# Patient Record
Sex: Female | Born: 1950 | Race: White | Hispanic: No | Marital: Married | Smoking: Never smoker
Health system: Southern US, Community
[De-identification: ages and names within clinical notes are randomized; demographics above are authoritative.]

## PROBLEM LIST (undated history)

## (undated) DIAGNOSIS — C4491 Basal cell carcinoma of skin, unspecified: Secondary | ICD-10-CM

## (undated) DIAGNOSIS — N393 Stress incontinence (female) (male): Secondary | ICD-10-CM

## (undated) DIAGNOSIS — I493 Ventricular premature depolarization: Secondary | ICD-10-CM

## (undated) DIAGNOSIS — E785 Hyperlipidemia, unspecified: Secondary | ICD-10-CM

## (undated) DIAGNOSIS — G4700 Insomnia, unspecified: Secondary | ICD-10-CM

## (undated) DIAGNOSIS — S0300XA Dislocation of jaw, unspecified side, initial encounter: Secondary | ICD-10-CM

## (undated) DIAGNOSIS — K120 Recurrent oral aphthae: Secondary | ICD-10-CM

## (undated) DIAGNOSIS — F419 Anxiety disorder, unspecified: Secondary | ICD-10-CM

## (undated) DIAGNOSIS — G43909 Migraine, unspecified, not intractable, without status migrainosus: Secondary | ICD-10-CM

## (undated) DIAGNOSIS — K529 Noninfective gastroenteritis and colitis, unspecified: Secondary | ICD-10-CM

## (undated) DIAGNOSIS — I491 Atrial premature depolarization: Secondary | ICD-10-CM

## (undated) DIAGNOSIS — H9319 Tinnitus, unspecified ear: Secondary | ICD-10-CM

## (undated) DIAGNOSIS — M1990 Unspecified osteoarthritis, unspecified site: Secondary | ICD-10-CM

## (undated) DIAGNOSIS — J309 Allergic rhinitis, unspecified: Secondary | ICD-10-CM

## (undated) DIAGNOSIS — R519 Headache, unspecified: Secondary | ICD-10-CM

## (undated) DIAGNOSIS — R002 Palpitations: Secondary | ICD-10-CM

## (undated) DIAGNOSIS — G629 Polyneuropathy, unspecified: Secondary | ICD-10-CM

## (undated) DIAGNOSIS — R51 Headache: Secondary | ICD-10-CM

## (undated) DIAGNOSIS — G8929 Other chronic pain: Secondary | ICD-10-CM

## (undated) DIAGNOSIS — J45909 Unspecified asthma, uncomplicated: Secondary | ICD-10-CM

## (undated) HISTORY — DX: Headache: R51

## (undated) HISTORY — PX: HAND SURGERY: SHX662

## (undated) HISTORY — DX: Other chronic pain: G89.29

## (undated) HISTORY — DX: Palpitations: R00.2

## (undated) HISTORY — DX: Noninfective gastroenteritis and colitis, unspecified: K52.9

## (undated) HISTORY — DX: Atrial premature depolarization: I49.1

## (undated) HISTORY — DX: Insomnia, unspecified: G47.00

## (undated) HISTORY — DX: Dislocation of jaw, unspecified side, initial encounter: S03.00XA

## (undated) HISTORY — DX: Stress incontinence (female) (male): N39.3

## (undated) HISTORY — DX: Ventricular premature depolarization: I49.3

## (undated) HISTORY — DX: Recurrent oral aphthae: K12.0

## (undated) HISTORY — DX: Polyneuropathy, unspecified: G62.9

## (undated) HISTORY — PX: MOLE REMOVAL: SHX2046

## (undated) HISTORY — DX: Migraine, unspecified, not intractable, without status migrainosus: G43.909

## (undated) HISTORY — DX: Unspecified osteoarthritis, unspecified site: M19.90

## (undated) HISTORY — DX: Headache, unspecified: R51.9

## (undated) HISTORY — DX: Basal cell carcinoma of skin, unspecified: C44.91

## (undated) HISTORY — DX: Allergic rhinitis, unspecified: J30.9

## (undated) HISTORY — DX: Unspecified asthma, uncomplicated: J45.909

## (undated) HISTORY — DX: Hyperlipidemia, unspecified: E78.5

## (undated) HISTORY — DX: Tinnitus, unspecified ear: H93.19

## (undated) HISTORY — DX: Anxiety disorder, unspecified: F41.9

## (undated) HISTORY — PX: OTHER SURGICAL HISTORY: SHX169

---

## 1975-10-25 HISTORY — PX: RHINOPLASTY: SUR1284

## 1994-09-23 HISTORY — PX: BREAST FIBROADENOMA SURGERY: SHX580

## 1994-10-24 HISTORY — PX: TENDON RELEASE: SHX230

## 2003-07-25 ENCOUNTER — Ambulatory Visit (HOSPITAL_BASED_OUTPATIENT_CLINIC_OR_DEPARTMENT_OTHER): Admission: RE | Admit: 2003-07-25 | Discharge: 2003-07-25 | Payer: Self-pay | Admitting: Orthopedic Surgery

## 2003-07-25 ENCOUNTER — Encounter (INDEPENDENT_AMBULATORY_CARE_PROVIDER_SITE_OTHER): Payer: Self-pay | Admitting: Specialist

## 2003-07-25 ENCOUNTER — Ambulatory Visit (HOSPITAL_COMMUNITY): Admission: RE | Admit: 2003-07-25 | Discharge: 2003-07-25 | Payer: Self-pay | Admitting: Orthopedic Surgery

## 2004-01-05 ENCOUNTER — Ambulatory Visit (HOSPITAL_COMMUNITY): Admission: RE | Admit: 2004-01-05 | Discharge: 2004-01-05 | Payer: Self-pay | Admitting: Gastroenterology

## 2004-04-21 ENCOUNTER — Other Ambulatory Visit: Admission: RE | Admit: 2004-04-21 | Discharge: 2004-04-21 | Payer: Self-pay | Admitting: *Deleted

## 2005-05-03 ENCOUNTER — Other Ambulatory Visit: Admission: RE | Admit: 2005-05-03 | Discharge: 2005-05-03 | Payer: Self-pay | Admitting: *Deleted

## 2006-06-12 ENCOUNTER — Other Ambulatory Visit: Admission: RE | Admit: 2006-06-12 | Discharge: 2006-06-12 | Payer: Self-pay | Admitting: Obstetrics & Gynecology

## 2006-10-25 ENCOUNTER — Encounter: Admission: RE | Admit: 2006-10-25 | Discharge: 2006-10-25 | Payer: Self-pay | Admitting: Family Medicine

## 2006-11-02 ENCOUNTER — Encounter: Admission: RE | Admit: 2006-11-02 | Discharge: 2006-11-02 | Payer: Self-pay | Admitting: Family Medicine

## 2006-12-08 ENCOUNTER — Encounter: Admission: RE | Admit: 2006-12-08 | Discharge: 2006-12-08 | Payer: Self-pay | Admitting: Family Medicine

## 2007-01-23 HISTORY — PX: BILATERAL SALPINGOOPHORECTOMY: SHX1223

## 2007-02-20 ENCOUNTER — Encounter (INDEPENDENT_AMBULATORY_CARE_PROVIDER_SITE_OTHER): Payer: Self-pay | Admitting: Specialist

## 2007-02-20 ENCOUNTER — Ambulatory Visit (HOSPITAL_COMMUNITY): Admission: RE | Admit: 2007-02-20 | Discharge: 2007-02-20 | Payer: Self-pay | Admitting: Obstetrics & Gynecology

## 2007-06-05 ENCOUNTER — Ambulatory Visit (HOSPITAL_BASED_OUTPATIENT_CLINIC_OR_DEPARTMENT_OTHER): Admission: RE | Admit: 2007-06-05 | Discharge: 2007-06-05 | Payer: Self-pay | Admitting: Orthopedic Surgery

## 2007-06-05 ENCOUNTER — Encounter (INDEPENDENT_AMBULATORY_CARE_PROVIDER_SITE_OTHER): Payer: Self-pay | Admitting: Orthopedic Surgery

## 2007-06-21 ENCOUNTER — Other Ambulatory Visit: Admission: RE | Admit: 2007-06-21 | Discharge: 2007-06-21 | Payer: Self-pay | Admitting: Obstetrics & Gynecology

## 2008-04-10 IMAGING — US US TRANSVAGINAL NON-OB
1 series · 14 of 25 positions shown · non-contrast
Comparison: None.

TRANSABDOMINAL AND TRANSVAGINAL PELVIC ULTRASOUND:

CLINICAL DATA: Lower abdominal pain
TECHNIQUE: Both transabdominal and transvaginal ultrasound examinations of the
pelvis were performed including evaluation of the uterus, ovaries, adnexal
regions, and pelvic cul-de-sac.

[Series 1: us transvaginal non-ob · 0.24mm/px · 14 of 52 slices shown]
[im 1/52]
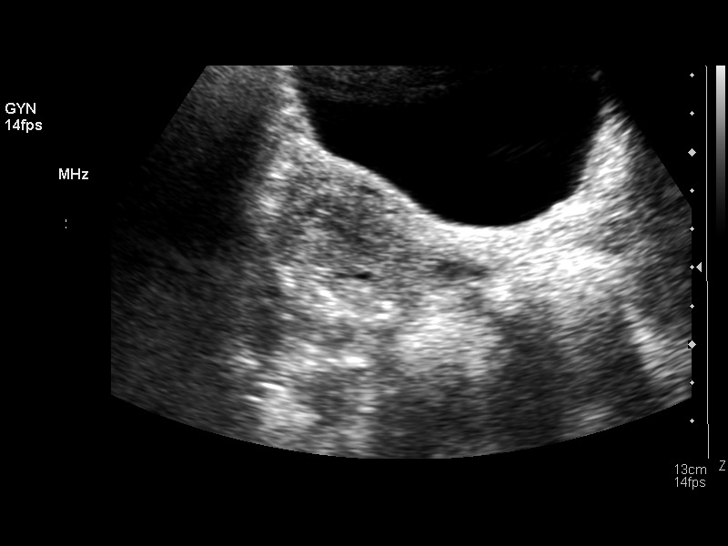
[im 5/52]
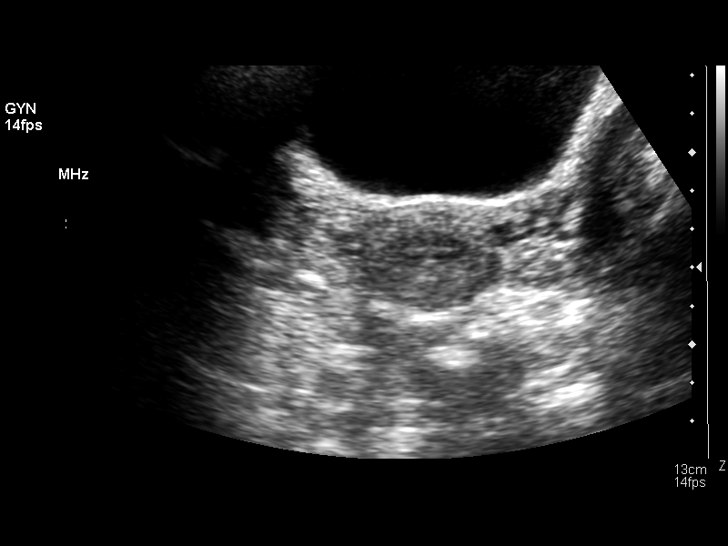
[im 9/52]
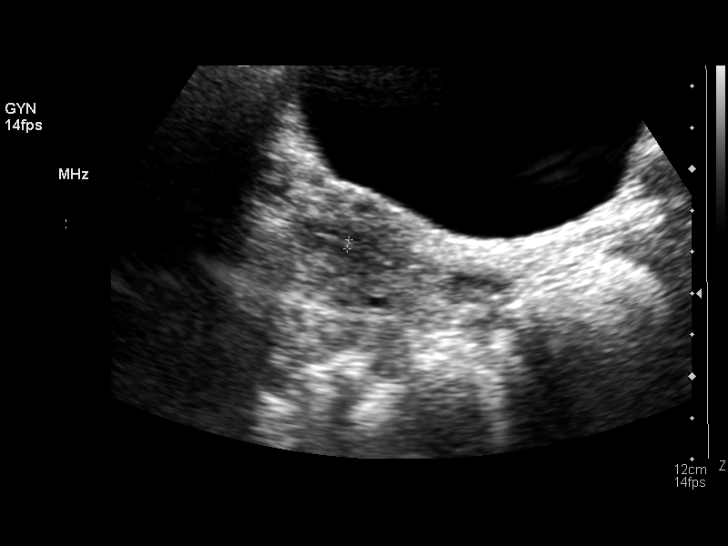
[im 13/52]
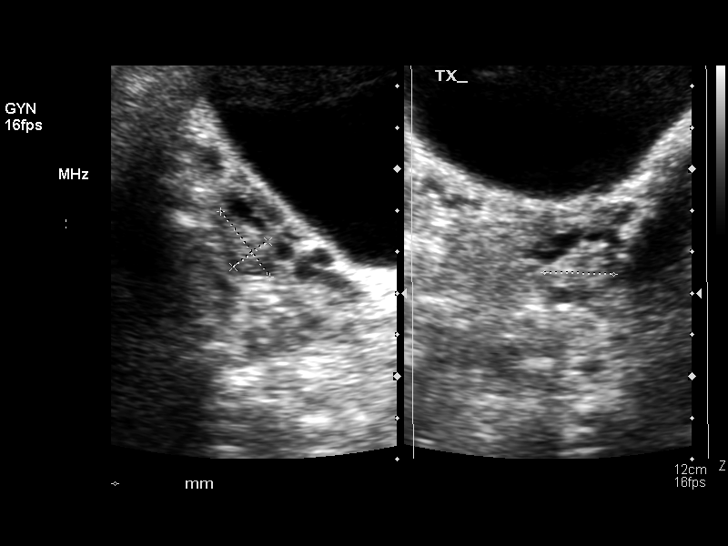
[im 18/52]
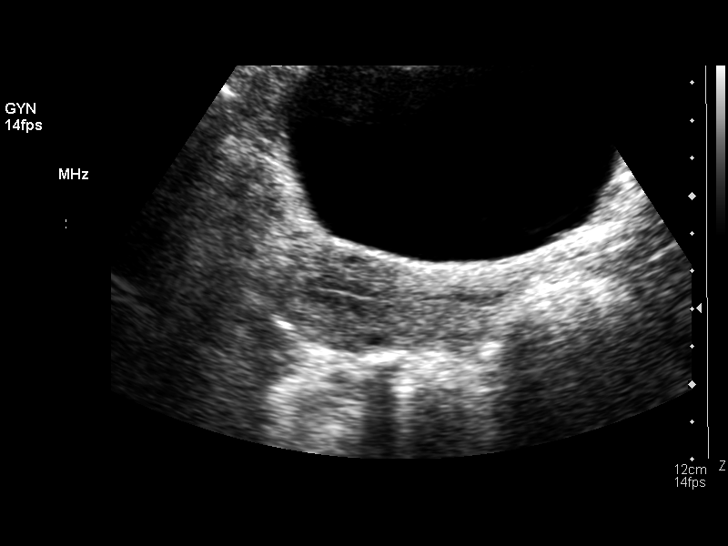
[im 20/52]
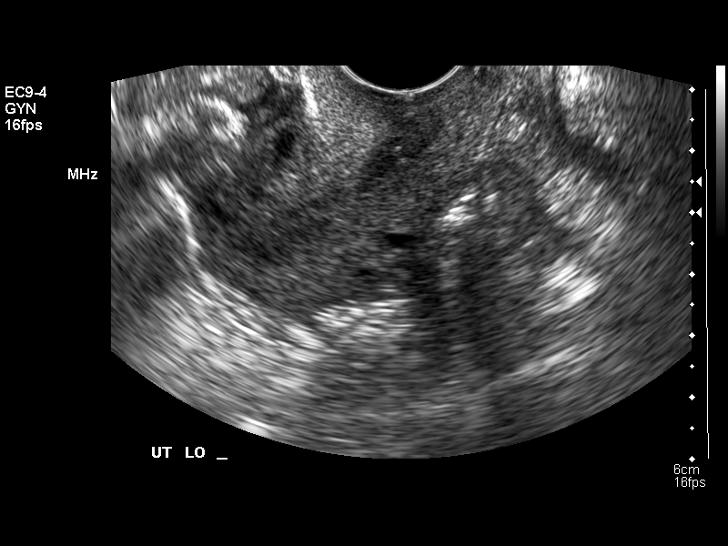
[im 24/52]
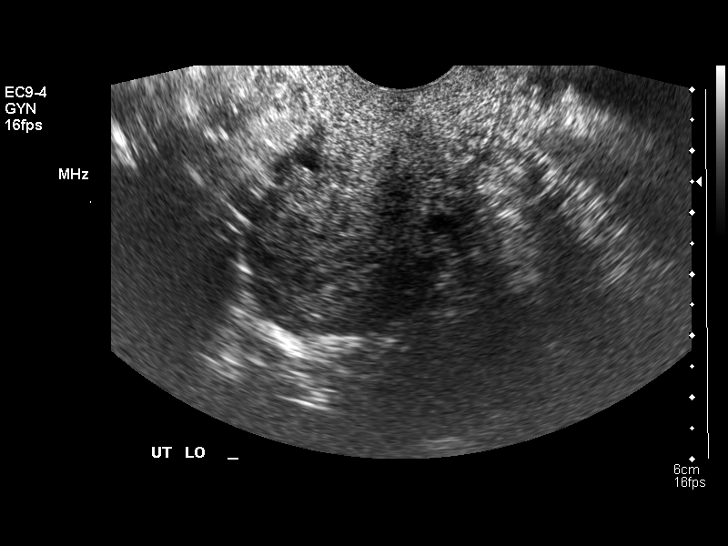
[im 28/52]
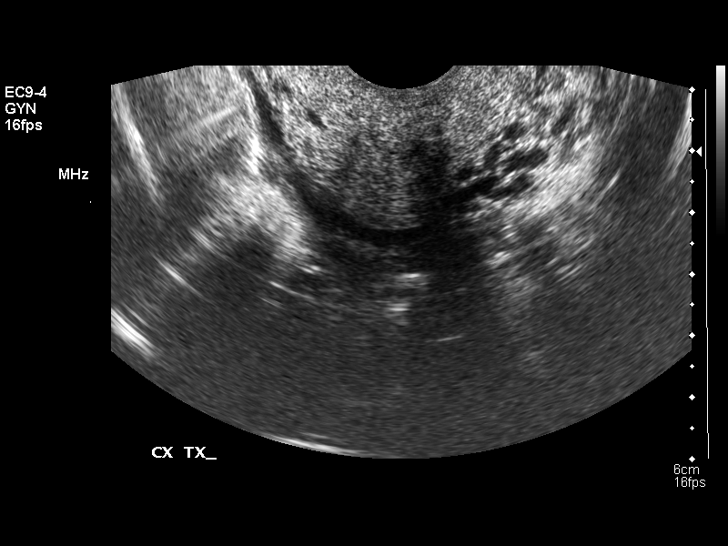
[im 32/52]
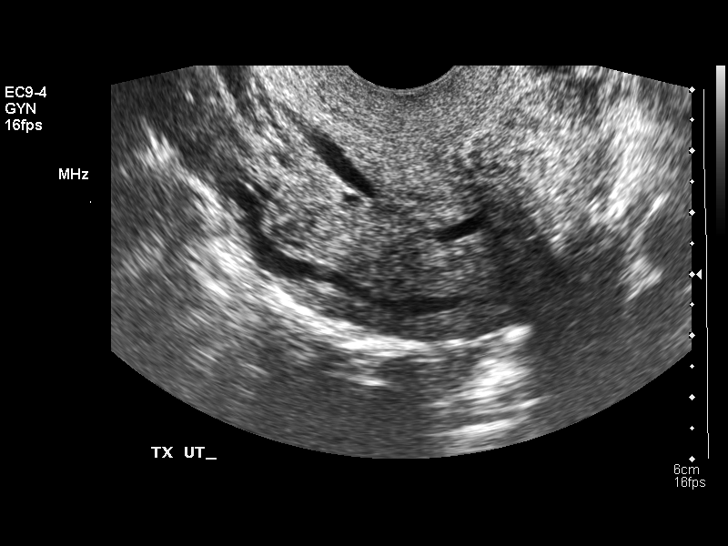
[im 35/52]
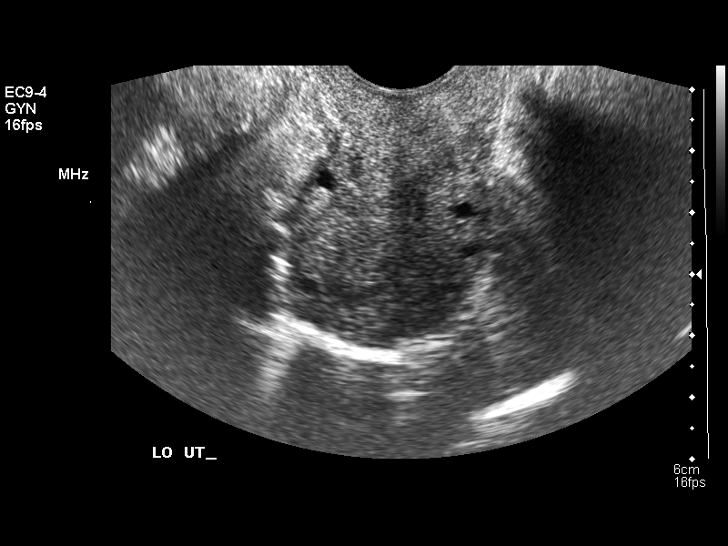
[im 39/52]
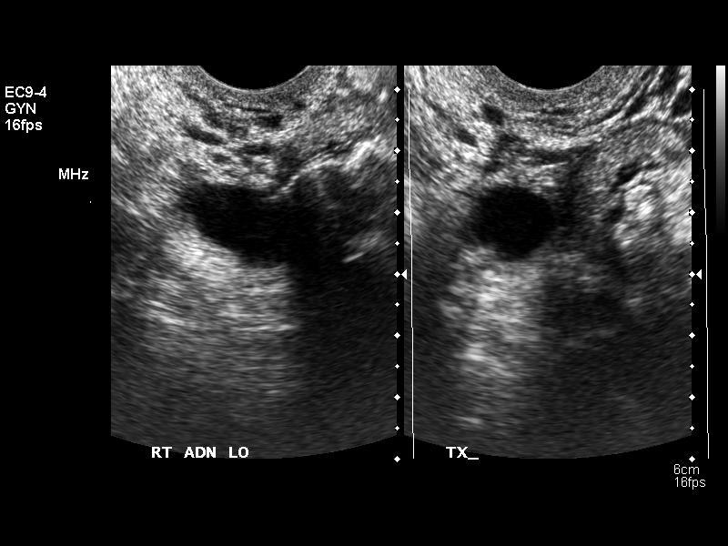
[im 43/52]
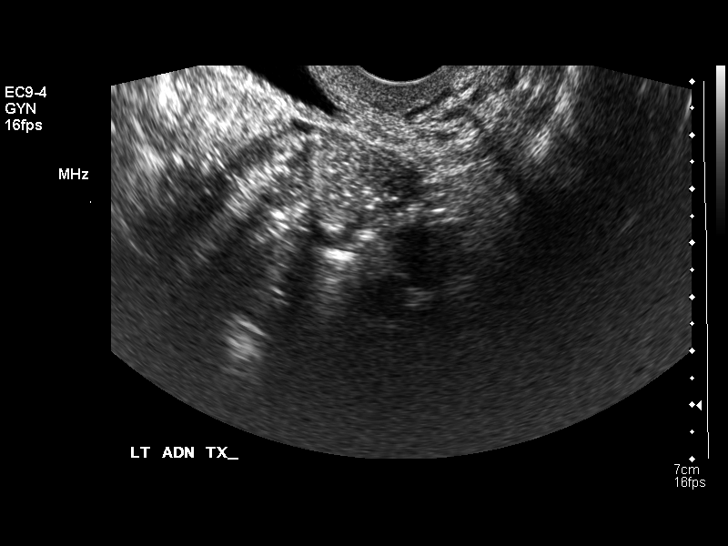
[im 47/52]
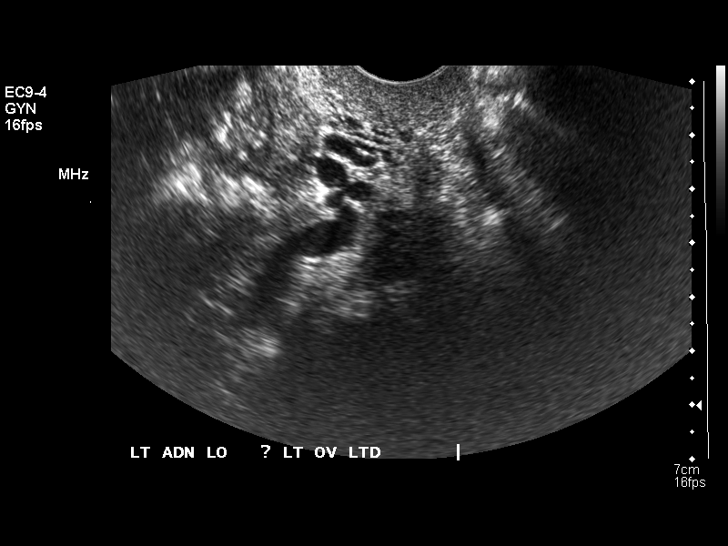
[im 52/52]
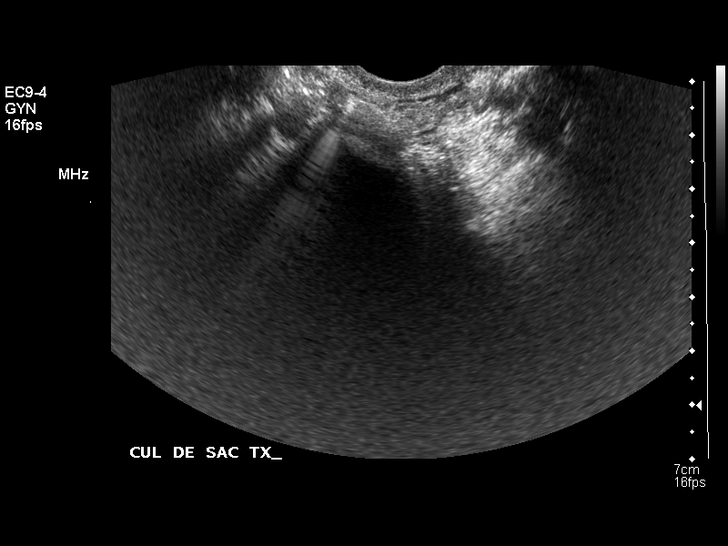

[14 of 25 positions shown; findings below may reference images not displayed]

FINDINGS: The uterus measures 6.1 x 2.9 x 3.8 cm.  Myometrial echotexture is
homogeneous. No evidence for fibroids.  Endometrial stripe thickness is 4.4 mm.

The ovaries are difficult to visualize. A 1.9 x 1.4 x 1.5 cm cystic focus in the
right adnexal space may be the right ovary. The left ovary measures 1.1 x 1.6 x
1.0 cm.

No evidence for free fluid in the cul-de-sac.
IMPRESSION: No sonographic evidence to explain this patient's history of pain.

Cystic area in the right adnexal space may be related to the right ovary. This
measures 1.9 cm in maximal dimension. In a postmenopausal female, persistent
ovarian cystic change would be considered abnormal. Followup ultrasound in 6
weeks is recommended to ensure resolution.

## 2008-06-23 ENCOUNTER — Other Ambulatory Visit: Admission: RE | Admit: 2008-06-23 | Discharge: 2008-06-23 | Payer: Self-pay | Admitting: Obstetrics and Gynecology

## 2009-06-24 DIAGNOSIS — G629 Polyneuropathy, unspecified: Secondary | ICD-10-CM

## 2009-06-24 HISTORY — DX: Polyneuropathy, unspecified: G62.9

## 2009-09-03 ENCOUNTER — Encounter: Admission: RE | Admit: 2009-09-03 | Discharge: 2009-09-03 | Payer: Self-pay | Admitting: Emergency Medicine

## 2011-02-18 IMAGING — CR DG LUMBAR SPINE COMPLETE 4+V
5 series · 5 of 5 positions shown · non-contrast
Comparison: None available

CLINICAL DATA: Low back pain

LUMBAR SPINE - COMPLETE 4+ VIEW

[view not recorded (1 of 5)]
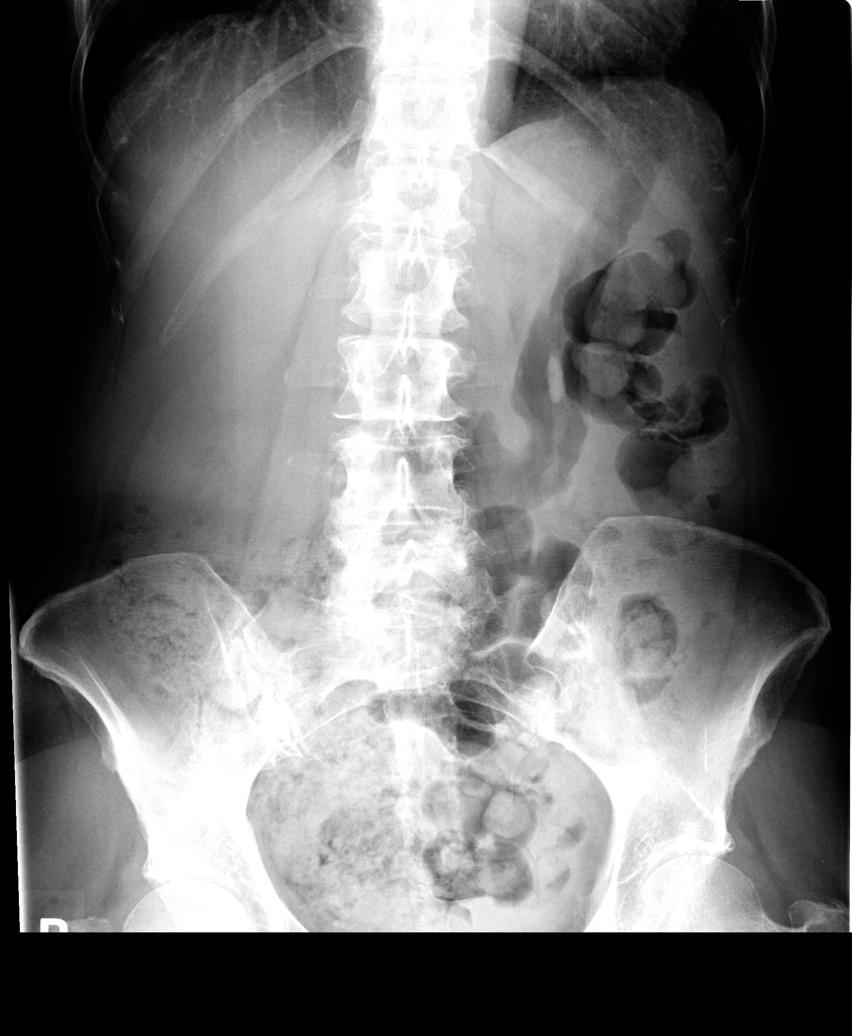

[view not recorded (2 of 5)]
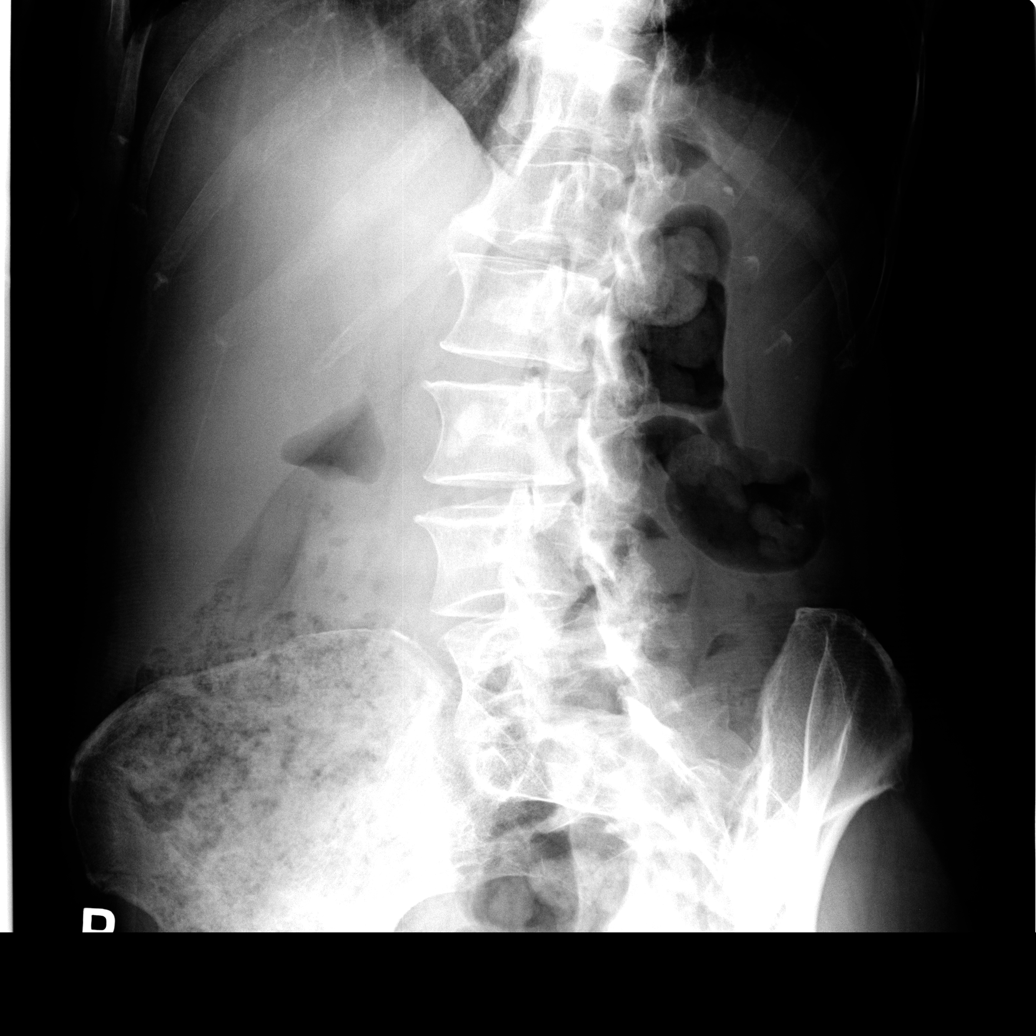

[view not recorded (3 of 5)]
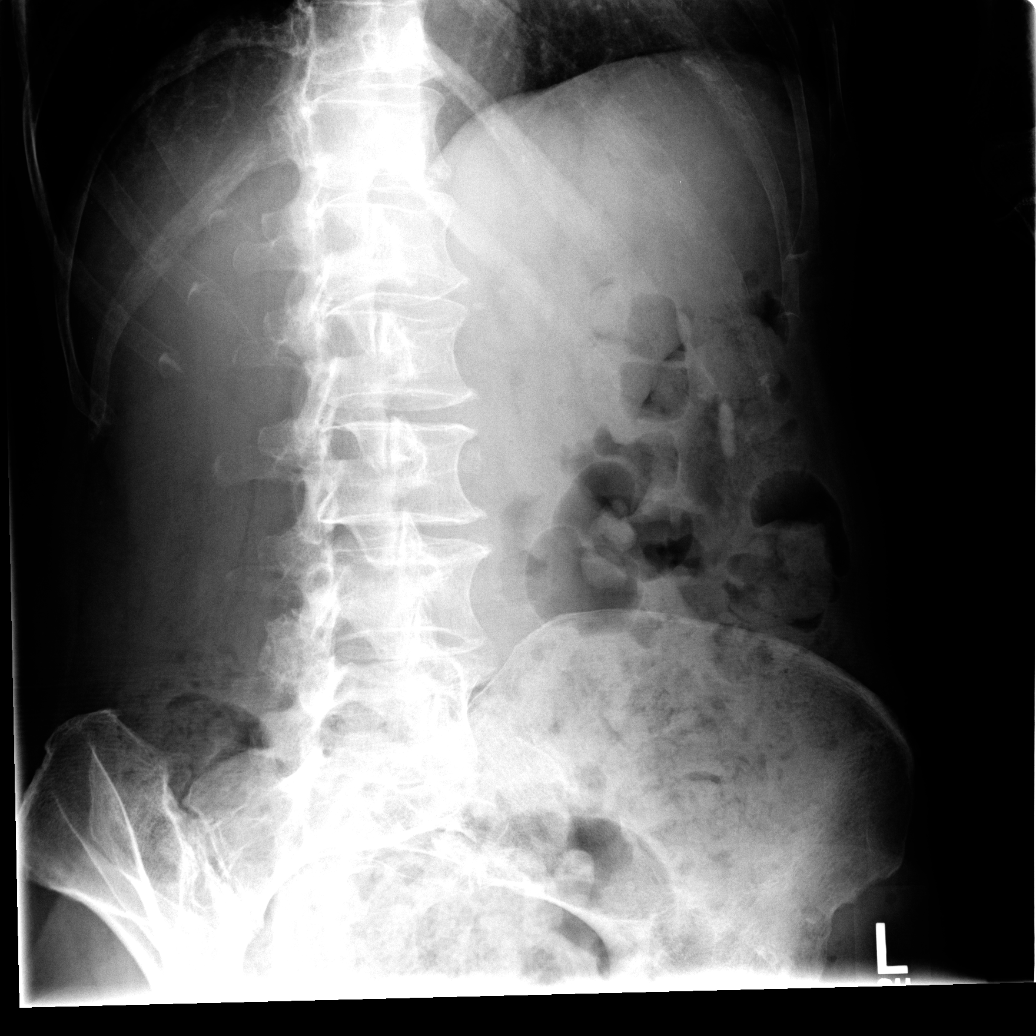

[view not recorded (4 of 5)]
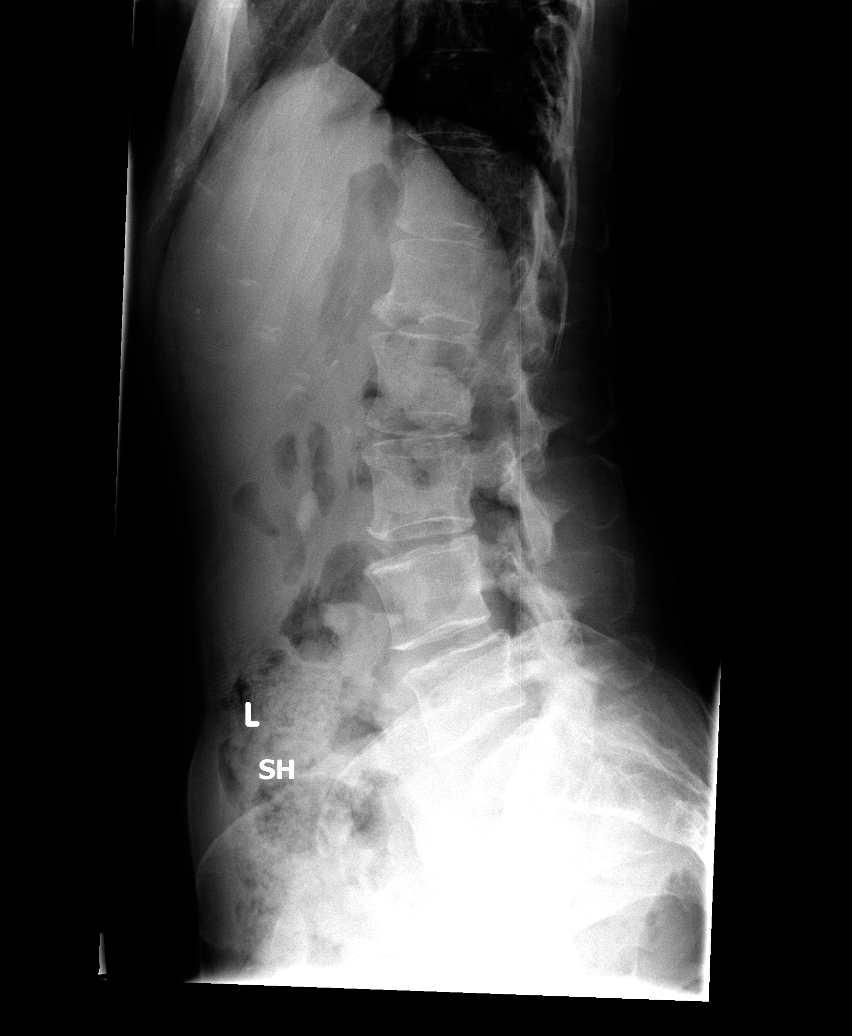

[view not recorded (5 of 5)]
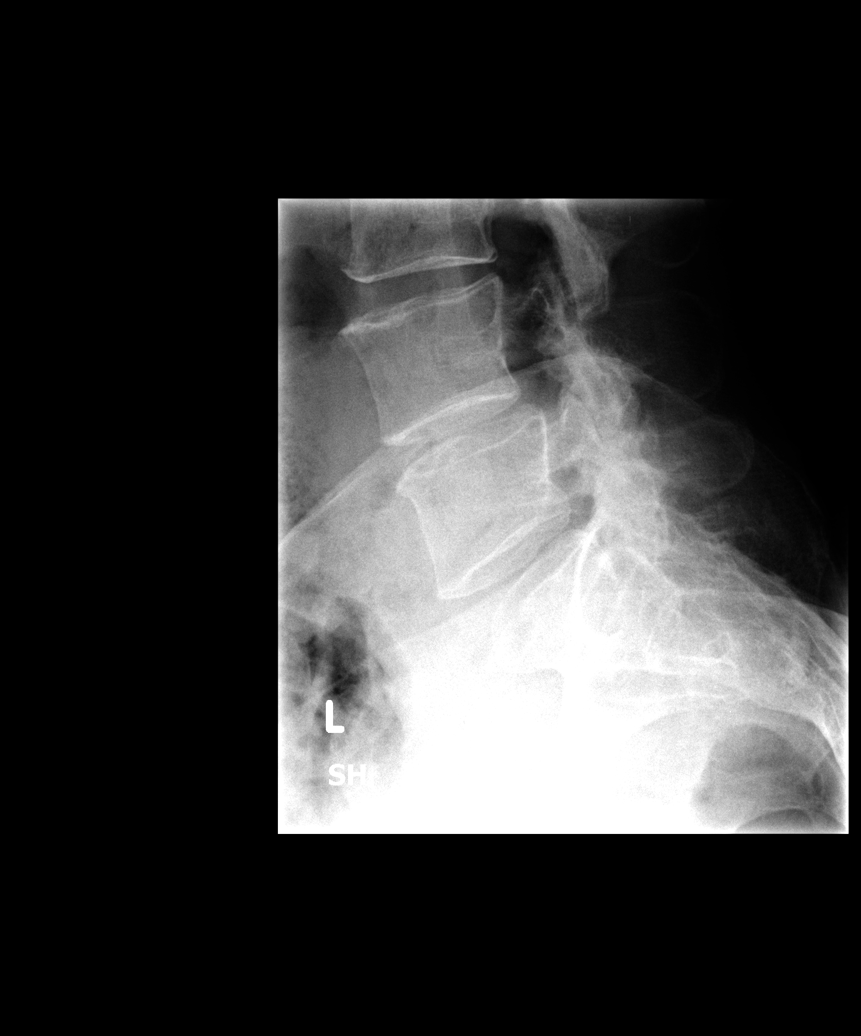

[5 of 5 positions shown; findings below may reference images not displayed]

FINDINGS: There are five lumbar-type vertebral bodies.  Lateral
projection demonstrates no loss of vertebral body height or disc
height.  There is endplate osteophytosis at essentially all levels.
This is mild in severity.  No evidence subluxation.  No evidence of
pars fracture on the oblique projections.

There is a moderate volume of stool within the colon rectosigmoid
colon.
IMPRESSION: 1.  No acute findings lumbar spine.
2.  Mild disc osteophytic disease.
3.  Moderate volume of stool.

## 2011-03-08 NOTE — Op Note (Signed)
NAMELAURIEANN, [name]                 ACCOUNT NO.:  [id number]   MEDICAL RECORD NO.:  [id number]          PATIENT TYPE:  AMB   LOCATION:  DSC                          FACILITY:  [facility]   PHYSICIAN:  Cindee Salt, M.D.       DATE OF BIRTH:  1951-03-23   DATE OF PROCEDURE:  06/05/2007  DATE OF DISCHARGE:                               OPERATIVE REPORT   PREOPERATIVE DIAGNOSIS:  Mucoid cyst right middle finger.   POSTOPERATIVE DIAGNOSIS:  Mucoid cyst right middle finger.   OPERATION:  Excision of mucoid cyst, debridement if distal  interphalangeal joint -- right middle finger.   SURGEON:  Cindee Salt, M.D.   ASSISTANT:  Carolyne Fiscal, RN   ANESTHESIA:  Forearm-based IV regional.   HISTORY:  The patient is a 60 year old female with a history of a large  mucoid cyst of the right middle finger.  She is desirous of having this  removed.  She is aware of the risks and complications, including  infection, recurrence of injury to arteries, nerves, tendons, not  complete relief of symptoms, dystrophy, loss of skin.  She is desirous  of proceeding.  The preoperative area of the extremity was marked by  both the patient and surgeon; questions were encouraged and answered.  Antibiotic given.   PROCEDURE:  The patient was brought to the operating room.  A forearm-  based IV regional anesthetic carried out without difficulty.  She was  prepped using DuraPrep in the supine position, with the right arm free.  A curvilinear incision was made over the GIP joint of her right middle  finger, carried down through subcutaneous tissue.  Bleeders were  electrocauterized.  The cyst was immediately encountered; with blunt and  sharp dissection this was dissected free.  The joint was then opened  radially and ulnarly.  A small House curet and small rongeur was then  used to remove osteophytes and do a partial synovectomy.  The joint was  then irrigated.  Skin was closed with interrupted 5-0 Vicryl Rapide  sutures.   Sterile compressive dressing and splint to the finger, holding  in full extension, was applied.  The patient tolerated the procedure  well and was taken to the recovery room for observation in satisfactory  condition.  She is discharged to home, to return the [facility] in 1 week on [place] NF.           ______________________________  Cindee Salt, M.D.     GK/MEDQ  D:  06/05/2007  T:  06/06/2007  Job:  [id number]   cc:   Oley Balm. Georgina Pillion, M.D.

## 2011-03-11 NOTE — Op Note (Signed)
NAME:  Margaret [name], Margaret [name]                           ACCOUNT NO.:  [id number]   MEDICAL RECORD NO.:  [id number]                   PATIENT TYPE:  AMB   LOCATION:  ENDO                                 [facility]ACILITY[facility]  [facility]   PHYSICIAN:  Graylin Shiver, M.D.                DATE OF BIRTH:  Aug 17, 1951   DATE OF PROCEDURE:  01/05/2004  DATE OF DISCHARGE:                                 OPERATIVE REPORT   PROCEDURE:  Colonoscopy.   INDICATION FOR PROCEDURE:  Screening.   Informed consent was obtained after explanation of the risks of bleeding,  infection, and perforation.   PREMEDICATIONS:  1. Fentanyl 62.5 mcg IV.  2. Versed 6 mg IV.   DESCRIPTION OF PROCEDURE:  With the patient in the left lateral decubitus  position, a rectal exam was performed.  No masses were felt.  The Olympus  colonoscope was inserted into the rectum and advanced around the colon to  the cecum.  Cecal landmarks were identified.  The cecum and ascending colon  were normal, the transverse colon normal.  The descending colon, sigmoid,  and rectum were normal.  She tolerated the procedure well without  complications.   IMPRESSION:  Normal colonoscopy to the cecum.                                               Graylin Shiver, M.D.    SFG/MEDQ  D:  01/05/2004  T:  01/05/2004  Job:  [id number]   cc:   Dr. Earlene Plater

## 2011-03-11 NOTE — Op Note (Signed)
Margaret [name], Margaret [name]                 ACCOUNT NO.:  [id number]   MEDICAL RECORD NO.:  [id number]          PATIENT TYPE:  AMB   LOCATION:  SDC                           FACILITY:  [facility]   PHYSICIAN:  M. Leda Quail, MD  DATE OF BIRTH:  1951/03/23   DATE OF PROCEDURE:  02/20/2007  DATE OF DISCHARGE:                               OPERATIVE REPORT   PREOPERATIVE DIAGNOSES:  68. A 60 year old G3, P0 married white female with right hydrosalpinx.  2. Atypical migraines.  3. History of breast fibroadenoma.   POSTOPERATIVE DIAGNOSIS:  A right paratubal cyst.  She had some  adhesions of the right ascending colon to the abdominal sidewall.   PROCEDURE:  Bilateral salpingo-oophorectomy done laparoscopically with  lysis of adhesions.   SURGEON:  Dr. Hyacinth Meeker   ASSISTANT:  Dr. Tresa Res   ANESTHESIA:  General endotracheal.   SPECIMENS:  Bilateral tubes and ovaries sent to pathology.   ESTIMATED BLOOD LOSS:  Minimal.   FLUIDS:  1400 mL of LR.   URINE OUTPUT:  200 mL.   COMPLICATIONS:  None.   INDICATIONS:  Ms. Margaret [name] is a 60 year old married white female who  does have a history of long-term chronic abdominal and pelvic  discomfort.  She has undergone a significant workup that did include a  pelvic ultrasound.  On the ultrasound, there was noted to be a right  hydrosalpinx that measured about 3 cm.  This has been followed  conservatively.  However, the patient is very anxious about this  finding.  She is an Astronomer. and quite knowledgeable.  This is causing some  mental distress, and she and I have discussed the most likely benign  nature of this finding.  She, however, has continued to desire surgical  excision.  We have discussed the risks and benefits of this, and she has  decided to go ahead and proceed with this.   PROCEDURE:  The patient is taken to the operating room.  She is placed  in supine position.  Anesthesia is administered by the anesthesia staff  without difficulty.  The  legs are positioned in the [place] stirrups in  the low lithotomy position.  Care is taken to ensure that the legs are  sitting loose in the [place] stirrups with no pressure points.  Sequential  compression devices are on her calves bilaterally.  The abdomen,  perineum, inner thighs, and vagina are prepped in a normal sterile  fashion.  Bivalve speculum is placed in the vagina.  The anterior lip of  the cervix is grasped with single-tooth tenaculum.  An acorn uterine  manipulator is advanced to the cervical os attached to the tenaculum as  a means of manipulating the uterus during the procedure.  The speculum  is removed from the vagina.  A Foley catheter is inserted into the  bladder and placed to straight drain to keep the bladder empty during  the procedure.   The patient is draped in a normal sterile fashion.   Attention is turned to the umbilicus; 3 mL of 0.5% Marcaine are  instilled in the umbilicus.  A 10 mm skin incision is made inferiorly  from the belly button.  The subcutaneous fat and tissue is dissected  with a hemostat.  The sacral promontory is palpated.  The abdomen is  elevated, and the Veress needle is inserted in the infraumbilical  incision and aimed toward the uterus beneath the level of the sacral  promontory.  Once the peritoneal layer is popped through, a syringe of  normal saline was obtained.  This was attached to the Veress needle.  It  was aspirated.  No fluid, blood, or other liquid was noted.  The fluid  is then injected and reaspirated, and no fluid is aspirated back.  Fluid  drips easily into the Veress needle.  Then CO2 gas was attached to the  Veress needle and on low flow, a pneumoperitoneum was easily achieved.  Low pressures were noted throughout the entire procedure.  These  pressures were no greater 7 mmHg.  Two and a half liters of CO2 gas was  placed in the abdomen.  The Veress needle was removed.  A nonbladed  trocar with an OptiVu port was  obtained.  The diagnostic scope was  placed into this trocar port.  Using a twisting motion and aiming toward  the uterus and below the level of the sacral promontory, the nonbladed  trocar and port were placed through the abdomen.  The abdominal layers  were visualized as they were separated by the OptiVu port.  Once  intraperitoneal placement was noted, the trocar was removed.  An  operative scope was placed through the midline port.  The the abdominal  wall layer was transilluminated, and locations were chosen for the right  and left lower quadrant ports after identification of the inferior  epigastric arteries.  Then 2 mL of 0.5% Marcaine were used to  anesthetize the skin.  Then 5 mm skin incisions were made.  Bladed  trocars were placed through these incisions bilaterally with direct  visualization of the laparoscope.  Care was taken to note location of  the bowel, as these were placed because the patient is quite thin.  These were placed without difficulty.   Attention was turned to the right side.  After the pelvis was surveyed  and photodocumentation was made, the right fallopian tube and ovary were  grasped.  Using a gyrus, the utero-ovarian pedicle was serially clamped,  cauterized, and then transected with the gyrus.  Once the utero-ovarian  pedicle was completely transected, the ovary was freely mobile from the  uterus.  The ureter was noted.  Two #0 Vicryl Endoloops were placed  around the IP ligament on the right side.  These sutures were well above  the level of the location of the ureter.  Once these 2 sutures were  tightened tightly and the sutures cut, the IP ligament distal to where  the sutures were placed were transected with endoscopic scissors.  The  right ovary was placed in the pelvis.  Attention was turned to the left  side.  In similar fashion, the ovary and fallopian tube were elevated. The utero-ovarian pedicle on the left side was serially clamped,   cauterized, and transected.  Again, the gyrus was used.  The sound  indicator was used to ensure that full cauterization of the pedicle was  performed.  Once the ovary was freely mobile from the uterus, two #0  Vicryl Endoloops were placed across the IP ligament on the left side.  Again, the ureter was identified before placing  these loops.  These both  were cinched tightly and the sutures cut short.  Then using endoscopic  scissors, the IP ligament was transected distal to the sutures.  At this  point, the [place] suction irrigator was used to irrigate both pedicles,  and no bleeding was noted.  The right ascending colon did have some  filmy adhesions to the right sidewall.  These were taken down sharply  using the gyrus.  The gyrus clamped, cauterized, and then incised the  adhesions until the adhesions were completely free from the sidewall.  Both ovaries were brought out midline through the operative port.  Once  these ovaries were out, they handed off to be sent to pathology.  At  this point, there was hemostasis in the pelvis, particularly on the IP  ligaments.  Photodocumentation was made.  The right and left lower  quadrant ports were removed, and the pneumoperitoneum was released.  The  midline trocar and the laparoscope were removed without difficulty.  The  operator's index finger was placed in the incision to ensure no bowel  was present prior to closing the fascia.   Fascia at the infraumbilical incision was closed with a single figure-of-  eight suture of #0 Vicryl.  The right and left lower quadrant incisions  were cleansed, and Dermabond was used to close the incisions.  The belly-  button was then closed with subcuticular stitch of 3-0 Vicryl, and  Dermabond was used to make the skin air tight.   Sponge, lap, needle, and instrument counts were correct x2.  The  patient's legs were taken out of the [place] stirrups.  She tolerated the  procedure very well and was extubated  without any difficulty.  The  patient was then taken to the recovery room in stable condition.      Lum Keas, MD  Electronically Signed     MSM/MEDQ  D:  02/20/2007  T:  02/20/2007  Job:  [id number]

## 2011-03-11 NOTE — Op Note (Signed)
   NAMECARLOTTA, Margaret [name]                           ACCOUNT NO.:  [id number]   MEDICAL RECORD NO.:  [id number]                   PATIENT TYPE:  AMB   LOCATION:  DSC                                  FACILITY:  [facility]   PHYSICIAN:  Cindee Salt, M.D.                    DATE OF BIRTH:  [birth year]   DATE OF PROCEDURE:  07/25/2003  DATE OF DISCHARGE:                                 OPERATIVE REPORT   PREOPERATIVE DIAGNOSIS:  Mucoid cyst, right middle finger.   POSTOPERATIVE DIAGNOSIS:  Mucoid cyst, right middle finger.   OPERATION:  Excision of mucoid cyst, debridement of distal interphalangeal  joint, right middle finger.   SURGEON:  Cindee Salt, M.D.   ASSISTANT:  Alfredo Bach.   ANESTHESIA:  Forearm-based IV regional.   HISTORY:  The patient is a 60 year old female with a history of a large  mucoid cyst deforming the nail of her right middle finger; she desires  removal.   PROCEDURE:  The patient was brought to the operating room where a forearm-  based IV regional anesthetic was carried without difficulty.  She was  prepped using Duraprep, supine position, right arm free.  A curvilinear  incision was made over the distal interphalangeal joint and carried down  through subcutaneous tissue.  The extensor tendon was identified.  A cyst  was present on the radial aspect of the extensor tendon.  The joint was  opened at that level.  The osteophyte was identified; this was removed with  a small rongeur, along with the cyst, which were went to pathology.  The  dissection was then carried underneath the skin to the distal cyst at the  dorsal palmar cuticle, which had deflated beneath the skin.  This was then  curetted and the cyst wall was removed and also sent to pathology.  Debridement of the joint then continued to the ulnar side, incision made on  the ulnar aspect of the tendon, the area debrided with a small rongeur; no  further osteophytes were identified.  The wound was irrigated.  The  skin was  then closed with interrupted 5-0 nylon sutures.  A sterile compressive  dressing and splint to the finger were applied.  The patient tolerated the  procedure well and was taken to the recovery room for observation in  satisfactory condition.   She is discharged home to return to the [facility] in one  week on Vicodin and Keflex.                                               Cindee Salt, M.D.    Angelique Blonder  D:  07/25/2003  T:  07/26/2003  Job:  [id number]

## 2011-08-08 LAB — POCT HEMOGLOBIN-HEMACUE
Hemoglobin: 13.9
Operator id: 123881

## 2012-01-12 ENCOUNTER — Other Ambulatory Visit: Payer: Self-pay | Admitting: Dermatology

## 2012-01-17 ENCOUNTER — Other Ambulatory Visit: Payer: Self-pay | Admitting: Dermatology

## 2012-10-30 ENCOUNTER — Encounter: Payer: Self-pay | Admitting: Internal Medicine

## 2012-10-30 ENCOUNTER — Ambulatory Visit (INDEPENDENT_AMBULATORY_CARE_PROVIDER_SITE_OTHER)
Admission: RE | Admit: 2012-10-30 | Discharge: 2012-10-30 | Disposition: A | Discharge status: Home / Self Care | Payer: BC Managed Care – PPO | Source: Ambulatory Visit | Attending: Internal Medicine | Admitting: Internal Medicine

## 2012-10-30 ENCOUNTER — Other Ambulatory Visit (INDEPENDENT_AMBULATORY_CARE_PROVIDER_SITE_OTHER): Payer: BC Managed Care – PPO

## 2012-10-30 ENCOUNTER — Ambulatory Visit (INDEPENDENT_AMBULATORY_CARE_PROVIDER_SITE_OTHER): Admitting: Internal Medicine

## 2012-10-30 VITALS — BP 118/62 | HR 68 | Ht 65.0 in | Wt 112.4 lb

## 2012-10-30 DIAGNOSIS — R0609 Other forms of dyspnea: Secondary | ICD-10-CM

## 2012-10-30 DIAGNOSIS — R0600 Dyspnea, unspecified: Secondary | ICD-10-CM

## 2012-10-30 DIAGNOSIS — R0989 Other specified symptoms and signs involving the circulatory and respiratory systems: Secondary | ICD-10-CM

## 2012-10-30 LAB — SEDIMENTATION RATE: Sed Rate: 17 mm/hr (ref 0–22)

## 2012-10-30 LAB — C-REACTIVE PROTEIN: CRP: <0.5 mg/dL (ref 0.5–20.0)

## 2012-10-30 LAB — CBC WITH DIFFERENTIAL/PLATELET
Basophils Absolute: 0 10*3/uL (ref 0.0–0.1)
Basophils Relative: 0.5 % (ref 0.0–3.0)
Eosinophils Absolute: 0.1 10*3/uL (ref 0.0–0.7)
Eosinophils Relative: 2 % (ref 0.0–5.0)
HCT: 45.8 % (ref 36.0–46.0)
Hemoglobin: 15.6 g/dL — ABNORMAL HIGH (ref 12.0–15.0)
Lymphocytes Relative: 27.3 % (ref 12.0–46.0)
Lymphs Abs: 1.6 10*3/uL (ref 0.7–4.0)
MCHC: 34.1 g/dL (ref 30.0–36.0)
MCV: 91.6 fl (ref 78.0–100.0)
Monocytes Absolute: 0.3 10*3/uL (ref 0.1–1.0)
Monocytes Relative: 4.3 % (ref 3.0–12.0)
Neutro Abs: 4 10*3/uL (ref 1.4–7.7)
Neutrophils Relative %: 65.9 % (ref 43.0–77.0)
Platelets: 234 10*3/uL (ref 150.0–400.0)
RBC: 5 Mil/uL (ref 3.87–5.11)
RDW: 12.7 % (ref 11.5–14.6)
WBC: 6 10*3/uL (ref 4.5–10.5)

## 2012-10-30 NOTE — Progress Notes (Signed)
10/30/12- 61 yoF never smoker SOB "breathing heavy after activity" per Dr Hyacinth Meeker; family history pulmonary fibrosis. Husband told her several times last fall but she seemed unusually short of breath during routine activities like housework. She really had not noticed most of the times when he commented. She denies significant cough, wheeze, chest tightness, pain, palpitation or swelling. Occasional substernal "heavy" feeling is not exertional and not radiating. There is no history of pneumonia, asthma, heart disease or significant anemia. Her weight has been stable. She reports several women in her family have been diagnosed with pulmonary fibrosis ( maternal grandmother, maternal aunt, great-aunt) She exercises most days with exercise bicycle. Her husband is very fit. A Tine test for TB exposure was uncertain, so she was treated with INH many years ago. Later PPD was negative.  Prior to Admission medications   Medication Sig Start Date End Date Taking? Authorizing Provider  b complex vitamins tablet Take 1 tablet by mouth daily.   Yes Historical Provider, MD  calcium-vitamin D (OSCAL WITH D) 500-200 MG-UNIT per tablet Take 1 tablet by mouth daily.    Yes Historical Provider, MD  conjugated estrogens (PREMARIN) vaginal cream Place 0.5 g vaginally 2 (two) times a week.   Yes Historical Provider, MD  Flaxseed, Linseed, (FLAX SEED OIL) 1000 MG CAPS Take 1 capsule by mouth daily.   Yes Historical Provider, MD  Garlic 100 MG TABS Take 1 tablet by mouth daily.   Yes Historical Provider, MD  Lysine HCl 500 MG TABS Take 1 tablet by mouth 2 (two) times daily.   Yes Historical Provider, MD  Multiple Vitamin (MULTIVITAMIN) tablet Take 1 tablet by mouth daily.   Yes Historical Provider, MD  Omega-3 Fatty Acids (FISH OIL) 1000 MG CAPS Take 2 capsules by mouth daily.   Yes Historical Provider, MD  sertraline (ZOLOFT) 50 MG tablet Take 50 mg by mouth daily.   Yes Historical Provider, MD  thiamine (VITAMIN B-1) 100  MG tablet Take 100 mg by mouth daily.   Yes Historical Provider, MD   Past Medical History  Diagnosis Date  . Hyperlipemia     diet only treatment  . Allergic rhinitis   . Chronic headaches   . Canker sore    Past Surgical History  Procedure Date  . Breast fibroadenoma surgery 10-1994  . Rhinoplasty 1977  . Tendon release [id number]  . Salpingoophorectomy 2008  . Oral surgeries     multiple-as a child   Family History  Problem Relation Age of Onset  . Pulmonary fibrosis Maternal Grandmother   . Pulmonary fibrosis Maternal Aunt   . Pulmonary fibrosis      mothers great aunt and great neice(cousin to patient)  . Heart failure Mother   . Atrial fibrillation Father     PAT   History   Social History  . Marital Status: Married    Spouse Name: N/A    Number of Children: N/A  . Years of Education: N/A   Occupational History  . retired    Social History Main Topics  . Smoking status: Never Smoker   . Smokeless tobacco: Not on file  . Alcohol Use: No  . Drug Use: No  . Sexually Active: Not on file   Other Topics Concern  . Not on file   Social History Narrative  . No narrative on file   ROS-see HPI Constitutional:   No-   weight loss, night sweats, fevers, chills, fatigue, lassitude. HEENT:   No-  headaches, difficulty  swallowing, tooth/dental problems, sore throat,       No-  sneezing, itching, ear ache, nasal congestion, post nasal drip,  CV:  No-   chest pain, orthopnea, PND, swelling in lower extremities, anasarca,                                  dizziness, palpitations Resp: +  shortness of breath with exertion or at rest.              No-   productive cough,  No non-productive cough,  No- coughing up of blood.              No-   change in color of mucus.  No- wheezing.   Skin: No-   rash or lesions. GI:  No-   heartburn, indigestion, abdominal pain, nausea, vomiting, diarrhea,                 change in bowel habits, loss of appetite GU: No-   dysuria, change  in color of urine, no urgency or frequency.  No- flank pain. MS:  No-   joint pain or swelling.  No- decreased range of motion.  No- back pain. Neuro-     nothing unusual Psych:  No- change in mood or affect. No depression or anxiety.  No memory loss.  OBJ- Physical Exam General- Alert, Oriented, Affect-appropriate, Distress- none acute. Petite. Skin- rash-none, lesions- none, excoriation- none Lymphadenopathy- none Head- atraumatic            Eyes- Gross vision intact, PERRLA, conjunctivae and secretions clear            Ears- Hearing, canals-normal            Nose- Clear, no-Septal dev, mucus, polyps, erosion, perforation             Throat- Mallampati II , mucosa clear , drainage- none, tonsils- atrophic Neck- flexible , trachea midline, no stridor , thyroid nl, carotid no bruit Chest - symmetrical excursion , unlabored           Heart/CV- RRR , no murmur , no gallop  , no rub, nl s1 s2                           - JVD- none , edema- none, stasis changes- none, varices- none           Lung- clear to P&A, wheeze- none, cough- none , dullness-none, rub- none           Chest wall-  Abd- tender-no, distended-no, bowel sounds-present, HSM- no Br/ Gen/ Rectal- Not done, not indicated Extrem- cyanosis- none, clubbing, none, atrophy- none, strength- nl Neuro- grossly intact to observation

## 2012-10-30 NOTE — Patient Instructions (Addendum)
Order- CXR        Dx dyspnea             PFT             6 MWT  Order- lab C reactive protein/ CRP, Sed rate, D-dimer, CBC

## 2012-10-31 LAB — D-DIMER, QUANTITATIVE: D-Dimer, Quant: 0.27 ug/mL-FEU (ref 0.00–0.48)

## 2012-11-01 ENCOUNTER — Ambulatory Visit (INDEPENDENT_AMBULATORY_CARE_PROVIDER_SITE_OTHER): Payer: BC Managed Care – PPO | Admitting: Internal Medicine

## 2012-11-01 DIAGNOSIS — R0989 Other specified symptoms and signs involving the circulatory and respiratory systems: Secondary | ICD-10-CM

## 2012-11-01 DIAGNOSIS — R0609 Other forms of dyspnea: Secondary | ICD-10-CM

## 2012-11-01 DIAGNOSIS — R0600 Dyspnea, unspecified: Secondary | ICD-10-CM

## 2012-11-01 LAB — PULMONARY FUNCTION TEST

## 2012-11-01 NOTE — Progress Notes (Signed)
PFT done today. 

## 2012-11-09 DIAGNOSIS — J45990 Exercise induced bronchospasm: Secondary | ICD-10-CM | POA: Insufficient documentation

## 2012-11-09 NOTE — Assessment & Plan Note (Addendum)
She seems well on physical exam and history is not compelling. Her primary concern is that family members have pulmonary fibrosis. Plan-chest x-ray, PFT, 6 minute walk, D. dimer, CBC, check for inflammation with CRP, sed rate.

## 2012-11-11 NOTE — Progress Notes (Signed)
Documentation for 6 minute walk test 

## 2012-11-13 ENCOUNTER — Telehealth: Payer: Self-pay | Admitting: Internal Medicine

## 2012-11-13 DIAGNOSIS — R0600 Dyspnea, unspecified: Secondary | ICD-10-CM

## 2012-11-13 DIAGNOSIS — I517 Cardiomegaly: Secondary | ICD-10-CM

## 2012-11-13 NOTE — Telephone Encounter (Signed)
I spoke with pt and she is requesting her PFT and SMW results. She is aware Dr. Maple Hudson is not in the afternoon. Please advise thanks

## 2012-11-14 ENCOUNTER — Telehealth: Payer: Self-pay | Admitting: Internal Medicine

## 2012-11-14 NOTE — Telephone Encounter (Signed)
I have given results to husband and he will have her call tomorrow.   Order- 2 D Echocardiogram   Dx dyspnea and cardiomegaly    Question myopathy

## 2012-11-14 NOTE — Telephone Encounter (Signed)
I called to discuss- spoke to husband and he will have her call tomorrow.  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 Note good DLCO. 6 MWT- saturation fell to 92%. More of a drop than I would have expected, especially with good DLCO, but not specific. It does correlate with her sense tha DOE has increased. CXR- I question mild CE and recommended Echocardiogram.

## 2012-11-15 NOTE — Telephone Encounter (Signed)
Pt returned call. Asks to be called at [phone]. Margaret [name]

## 2012-11-15 NOTE — Telephone Encounter (Signed)
Spoke with patient-aware of recs from CY and agrees to have Echo test done. I have placed order to [facility] and pt aware she will get a call about date/time and location of test.

## 2012-11-16 ENCOUNTER — Ambulatory Visit (HOSPITAL_COMMUNITY): Payer: BC Managed Care – PPO | Attending: Cardiology | Admitting: Radiology

## 2012-11-16 DIAGNOSIS — I059 Rheumatic mitral valve disease, unspecified: Secondary | ICD-10-CM | POA: Insufficient documentation

## 2012-11-16 DIAGNOSIS — I369 Nonrheumatic tricuspid valve disorder, unspecified: Secondary | ICD-10-CM | POA: Insufficient documentation

## 2012-11-16 DIAGNOSIS — R0989 Other specified symptoms and signs involving the circulatory and respiratory systems: Secondary | ICD-10-CM | POA: Insufficient documentation

## 2012-11-16 DIAGNOSIS — I517 Cardiomegaly: Secondary | ICD-10-CM

## 2012-11-16 DIAGNOSIS — R0609 Other forms of dyspnea: Secondary | ICD-10-CM | POA: Insufficient documentation

## 2012-11-16 DIAGNOSIS — R0600 Dyspnea, unspecified: Secondary | ICD-10-CM

## 2012-11-16 NOTE — Progress Notes (Signed)
Echocardiogram performed.  

## 2012-11-19 ENCOUNTER — Other Ambulatory Visit: Payer: Self-pay | Admitting: Dermatology

## 2012-11-20 ENCOUNTER — Telehealth: Payer: Self-pay | Admitting: Internal Medicine

## 2012-11-20 NOTE — Progress Notes (Signed)
Quick Note:  Pt aware of results. ______ 

## 2012-11-21 ENCOUNTER — Telehealth: Payer: Self-pay | Admitting: Internal Medicine

## 2012-11-21 MED ORDER — ALBUTEROL SULFATE HFA 108 (90 BASE) MCG/ACT IN AERS: 2.0000 | INHALATION_SPRAY | Freq: Four times a day (QID) | RESPIRATORY_TRACT | Status: DC | PRN

## 2012-11-21 NOTE — Telephone Encounter (Signed)
I explained normal echocardiogram. We will treat her dyspnea as mild exertional asthma, with prn use of a rescue inhaler. Script ordered.

## 2012-11-27 NOTE — Telephone Encounter (Signed)
Per CY-this message has be handled; he spoke with patient's husband personally about this. Will sign off on phone note.

## 2012-11-29 ENCOUNTER — Encounter: Payer: Self-pay | Admitting: Internal Medicine

## 2012-11-29 ENCOUNTER — Ambulatory Visit (INDEPENDENT_AMBULATORY_CARE_PROVIDER_SITE_OTHER): Payer: BC Managed Care – PPO | Admitting: Internal Medicine

## 2012-11-29 VITALS — BP 110/68 | HR 67 | Ht 65.0 in | Wt 112.4 lb

## 2012-11-29 DIAGNOSIS — J45990 Exercise induced bronchospasm: Secondary | ICD-10-CM

## 2012-11-29 NOTE — Progress Notes (Signed)
10/30/12- 61 yoF never smoker SOB "breathing heavy after activity" per Dr Hyacinth Meeker; family history pulmonary fibrosis. Husband told her several times last fall but she seemed unusually short of breath during routine activities like housework. She really had not noticed most of the times when he commented. She denies significant cough, wheeze, chest tightness, pain, palpitation or swelling. Occasional substernal "heavy" feeling is not exertional and not radiating. There is no history of pneumonia, asthma, heart disease or significant anemia. Her weight has been stable. She reports several women in her family have been diagnosed with pulmonary fibrosis ( maternal grandmother, maternal aunt, great-aunt) She exercises most days with exercise bicycle. Her husband is very fit. A Tine test for TB exposure was uncertain, so she was treated with INH many years ago. Later PPD was negative.  11/29/12- 61 yoF never smoker SOB "breathing heavy after activity"/asthma ; family history pulmonary fibrosis. FOLLOWS FOR: review , PFT, and 2DEcho results with patient. Pt states breathing is about the same since last visit in 10-30-12 She feels well and breathing has not changed with recent cold weather. She has not needed to try her rescue inhaler. We had called her results of her recent testsathave gone over those again with her today. 11/11/12- 90%, 92%, 98%, 649.5 m. Complained of bilateral leg pains during the past but no oxygenation limit. 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 FVC 2.49/79%, FEV11.88/81%, FEV1/FVC 0.76, FEF 25-75% 1.50/57%. Significant improvement bronchodilator. TLC 82%, DLCO 97%. Echocardiogram 11/16/2012 EF 60-65%, normal with no evidence of pulmonary hypertension CXR 11/01/2012-IMPRESSION:       (There was no evidence of pulmonary fibrosis which was one of her early concerns.) No acute  cardiopulmonary abnormality seen.  Original Report Authenticated By: Lupita Raider., M.D  ROS-see HPI Constitutional:   No-   weight loss, night sweats, fevers, chills, fatigue, lassitude. HEENT:   No-  headaches, difficulty swallowing, tooth/dental problems, sore throat,       No-  sneezing, itching, ear ache, nasal congestion, post nasal drip,  CV:  No-   chest pain, orthopnea, PND, swelling in lower extremities, anasarca,                                  dizziness, palpitations Resp: +  shortness of breath with exertion or at rest.              No-   productive cough,  No non-productive cough,  No- coughing up of blood.              No-   change in color of mucus.  No- wheezing.   Skin: No-   rash or lesions. GI:  No-   heartburn, indigestion, abdominal pain, nausea, vomiting,  GU: . MS:  No-   joint pain or swelling.   Neuro-     nothing unusual Psych:  No- change in mood or affect. No depression or anxiety.  No memory loss.  OBJ- Physical Exam General- Alert, Oriented, Affect-appropriate, Distress- none acute. Petite. Skin- rash-none, lesions- none, excoriation- none Lymphadenopathy- none Head- atraumatic            Eyes- Gross vision intact, PERRLA, conjunctivae and secretions clear            Ears- Hearing, canals-normal            Nose- Clear, no-Septal dev, mucus,  polyps, erosion, perforation             Throat- Mallampati II , mucosa clear , drainage- none, tonsils- atrophic Neck- flexible , trachea midline, no stridor , thyroid nl, carotid no bruit Chest - symmetrical excursion , unlabored           Heart/CV- RRR , no murmur , no gallop  , no rub, nl s1 s2                           - JVD- none , edema- none, stasis changes- none, varices- none           Lung- clear to P&A, wheeze- none, cough- none , dullness-none, rub- none           Chest wall-  Abd-  Br/ Gen/ Rectal- Not done, not indicated Extrem- cyanosis- none, clubbing, none, atrophy- none, strength- nl Neuro-  grossly intact to observation

## 2012-11-29 NOTE — Patient Instructions (Addendum)
Let's try treating the shortness of breath with exertion as a mild asthma. Consider using 2 puffs twice daily for a few days at breakfast and lunch arbitrarily.

## 2012-12-07 NOTE — Assessment & Plan Note (Signed)
Significant reversible obstruction in small airways is most consistent with a diagnosis of asthma. She does not have evidence of pulmonary fibrosis by chest x-ray. Plan-educated on use of a rescue inhaler before exercise. Note that she reported leg pain during exercise on a 6 minute walk test. Significance is unclear and I have asked her to discuss that with her primary physician.

## 2013-02-11 ENCOUNTER — Telehealth: Payer: Self-pay | Admitting: Internal Medicine

## 2013-02-11 ENCOUNTER — Encounter: Payer: Self-pay | Admitting: *Deleted

## 2013-02-11 NOTE — Telephone Encounter (Signed)
I have printed a new activation letter for the pt and will

## 2013-02-12 NOTE — Telephone Encounter (Signed)
I mailed this to the pt and she is aware.

## 2013-02-12 NOTE — Telephone Encounter (Signed)
Verlon Au, did you place new activation code somewhere for patient pickup? Please advise, thank you

## 2013-02-21 ENCOUNTER — Other Ambulatory Visit: Payer: Self-pay | Admitting: Dermatology

## 2013-06-25 ENCOUNTER — Other Ambulatory Visit: Payer: Self-pay | Admitting: Dermatology

## 2013-07-19 ENCOUNTER — Encounter: Payer: Self-pay | Admitting: Obstetrics & Gynecology

## 2013-07-22 ENCOUNTER — Other Ambulatory Visit: Payer: Self-pay | Admitting: Dermatology

## 2013-08-29 ENCOUNTER — Other Ambulatory Visit: Payer: Self-pay

## 2013-10-07 ENCOUNTER — Telehealth: Payer: Self-pay | Admitting: Internal Medicine

## 2013-10-07 ENCOUNTER — Encounter: Payer: Self-pay | Admitting: Obstetrics & Gynecology

## 2013-10-07 NOTE — Telephone Encounter (Signed)
Appropriate candidate for pneumonia conjugate vaccine 13

## 2013-10-07 NOTE — Telephone Encounter (Signed)
I called and spoke with pt. She reports she has not had a PNA vaccine. She wants to know if Dr. Maple Hudson thinks she should get one or if she could get one. Please advise thanks

## 2013-10-07 NOTE — Telephone Encounter (Signed)
i spoke with pt and she was placed on injection schedule. Nothing further needed

## 2013-10-08 ENCOUNTER — Ambulatory Visit (INDEPENDENT_AMBULATORY_CARE_PROVIDER_SITE_OTHER): Payer: BC Managed Care – PPO

## 2013-10-08 DIAGNOSIS — Z23 Encounter for immunization: Secondary | ICD-10-CM

## 2013-11-19 ENCOUNTER — Other Ambulatory Visit: Payer: Self-pay | Admitting: Dermatology

## 2013-12-18 ENCOUNTER — Encounter: Payer: Self-pay | Admitting: Obstetrics & Gynecology

## 2013-12-18 ENCOUNTER — Ambulatory Visit: Payer: Self-pay | Admitting: Obstetrics & Gynecology

## 2013-12-20 ENCOUNTER — Ambulatory Visit (INDEPENDENT_AMBULATORY_CARE_PROVIDER_SITE_OTHER): Payer: BC Managed Care – PPO | Admitting: Obstetrics & Gynecology

## 2013-12-20 ENCOUNTER — Encounter: Payer: Self-pay | Admitting: Obstetrics & Gynecology

## 2013-12-20 VITALS — BP 124/68 | HR 56 | Resp 12 | Ht 64.5 in | Wt 111.2 lb

## 2013-12-20 DIAGNOSIS — Z124 Encounter for screening for malignant neoplasm of cervix: Secondary | ICD-10-CM

## 2013-12-20 DIAGNOSIS — Z0000 Encounter for general adult medical examination without abnormal findings: Secondary | ICD-10-CM

## 2013-12-20 DIAGNOSIS — Z01419 Encounter for gynecological examination (general) (routine) without abnormal findings: Secondary | ICD-10-CM

## 2013-12-20 LAB — HEMOGLOBIN, FINGERSTICK: Hemoglobin, fingerstick: 16.1 g/dL — ABNORMAL HIGH (ref 12.0–16.0)

## 2013-12-20 MED ORDER — ESTROGENS, CONJUGATED 0.625 MG/GM VA CREA: 0.5000 g | TOPICAL_CREAM | VAGINAL | Status: DC

## 2013-12-20 NOTE — Progress Notes (Signed)
63 y.o. G3P3 MarriedCaucasianF here for annual exam.  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  No vaginal bleeding.  Doing well.  Saw Dr. Inda Merlin 2/15.  Labs normal.    Patient's last menstrual period was 03/25/2003.          Sexually active: yes  The current method of family planning is post menopausal status.    Exercising: yes  exercise bike, yoga, back and neck, and abdominal exercises Smoker:  no  Health Maintenance: Pap:  09/07/11 WNL/negative HR HPV History of abnormal Pap:  yes MMG:  07/23/13 3D normal Colonoscopy:  2010-repeat in 10 years (Dr. Watt Climes) BMD:   2009-normal TDaP:  2/15 Screening Labs: PCP, Hb today: PCP, Urine today: negative   reports that she has never smoked. She has never used smokeless tobacco. She reports that she does not drink alcohol or use illicit drugs.  Past Medical History  Diagnosis Date  . Hyperlipemia     diet only treatment  . Allergic rhinitis   . Chronic headaches   . Canker sore   . Neuropathy 06/2009  . Asthma     mild    Past Surgical History  Procedure Laterality Date  . Breast fibroadenoma surgery  10-1994  . Rhinoplasty  1977  . Tendon release  [id number]  . Salpingoophorectomy  2008  . Oral surgeries      multiple-as a child  . Hand surgery      for cyst  . Mole removal      multiple    Current Outpatient Prescriptions  Medication Sig Dispense Refill  . albuterol (PROVENTIL HFA;VENTOLIN HFA) 108 (90 BASE) MCG/ACT inhaler Inhale 2 puffs into the lungs every 6 (six) hours as needed for wheezing or shortness of breath.  1 Inhaler  6  . b complex vitamins tablet Take 1 tablet by mouth daily.      . calcium-vitamin D (OSCAL WITH D) 500-200 MG-UNIT per tablet Take 1 tablet by mouth daily.       Marland Kitchen conjugated estrogens (PREMARIN) vaginal cream Place 0.5 g vaginally 2 (two) times a week.      . Flaxseed, Linseed, (FLAX SEED OIL) 1000 MG CAPS Take 1 capsule by mouth daily.      . Garlic 253 MG TABS Take 1 tablet by mouth  daily.      Marland Kitchen Lysine HCl 500 MG TABS Take 1 tablet by mouth 2 (two) times daily.      . Multiple Vitamin (MULTIVITAMIN) tablet Take 1 tablet by mouth daily.      . Omega-3 Fatty Acids (FISH OIL) 1000 MG CAPS Take 2 capsules by mouth daily.      Marland Kitchen pyridOXINE (VITAMIN B-6) 100 MG tablet Take 100 mg by mouth daily.      . sertraline (ZOLOFT) 50 MG tablet Take 50 mg by mouth daily.       No current facility-administered medications for this visit.    Family History  Problem Relation Age of Onset  . Pulmonary fibrosis Maternal Grandmother   . Pulmonary fibrosis Maternal Aunt   . Pulmonary fibrosis      mothers great aunt and great neice(cousin to patient)  . Heart failure Mother   . Atrial fibrillation Father     PAT    ROS:  Pertinent items are noted in HPI.  Otherwise, a comprehensive ROS was negative.  Exam:   BP 124/68  Pulse 56  Resp 12  Ht 5' 4.5" (1.638 m)  Wt 111  lb 3.2 oz (50.44 kg)  BMI 18.80 kg/m2  LMP 03/25/2003  Weight change: @WEIGHTCHANGE @ Height:   Height: 5' 4.5" (163.8 cm)  Ht Readings from Last 3 Encounters:  12/20/13 5' 4.5" (1.638 m)  11/29/12 5\' 5"  (1.651 m)  10/30/12 5\' 5"  (1.651 m)    General appearance: alert, cooperative and appears stated age Head: Normocephalic, without obvious abnormality, atraumatic Neck: no adenopathy, supple, symmetrical, trachea midline and thyroid normal to inspection and palpation Lungs: clear to auscultation bilaterally Breasts: normal appearance, no masses or tenderness Heart: regular rate and rhythm Abdomen: soft, non-tender; bowel sounds normal; no masses,  no organomegaly Extremities: extremities normal, atraumatic, no cyanosis or edema Skin: Skin color, texture, turgor normal. No rashes or lesions Lymph nodes: Cervical, supraclavicular, and axillary nodes normal. No abnormal inguinal nodes palpated Neurologic: Grossly normal   Pelvic: External genitalia:  no lesions              Urethra:  normal appearing urethra  with no masses, tenderness or lesions              Bartholins and Skenes: normal                 Vagina: normal appearing vagina with normal color and discharge, no lesions              Cervix: no lesions              Pap taken: yes Bimanual Exam:  Uterus:  normal size, contour, position, consistency, mobility, non-tender              Adnexa: normal adnexa and no mass, fullness, tenderness               Rectovaginal: Confirms               Anus:  normal sphincter tone, no lesions  A:  Well Woman with normal exam PMP, No HRT H/O Lap BSO 4/08 Vaginal atrophic changes Depression/anxiety H/O recurrent UTIs  P:   Mammogram yearly. pap smear today BMD will be scheduled Premarin vaginal cream 1/2 gram vaginally twice weekly.  #30 grams/4RF Pyridium 100mg  TID up to 7 days prn #21/3RF Cipro 500mg  BID x 7D #14/3RF Labs with Dr. Inda Merlin return annually or prn  An After Visit Summary was printed and given to the patient.

## 2013-12-20 NOTE — Patient Instructions (Signed)

## 2013-12-23 LAB — IPS PAP SMEAR ONLY

## 2013-12-24 ENCOUNTER — Telehealth: Payer: Self-pay

## 2013-12-24 NOTE — Telephone Encounter (Signed)
Left message for patient that BMD and MMG scheduled for 07/24/14 at 9:00 and 9:30.

## 2014-01-09 ENCOUNTER — Telehealth: Payer: Self-pay | Admitting: *Deleted

## 2014-01-10 NOTE — Telephone Encounter (Signed)
Opened in error

## 2014-02-20 ENCOUNTER — Other Ambulatory Visit: Payer: Self-pay | Admitting: Dermatology

## 2014-02-27 ENCOUNTER — Telehealth: Payer: Self-pay | Admitting: Obstetrics & Gynecology

## 2014-02-27 NOTE — Telephone Encounter (Signed)
Spoke with patient. Patient states that she is currently taking Zoloft and has been prescribed Cipro for when she is on business trips just in case she needs it. States when she went to pick up Cipro she was told there is a high risk associated with the heart while taking these two medications together. Patient would like to know if there is something else that Dr.Miller could prescribe that would not have this same risk. Patient is allergic to sulfa antibiotics. Advised would send a message over to [place] regarding medication recommendation and medication change while taking Zoloft and give patient a call back. Patient agreeable.   Dr.Miller, what medication do you recommend for patient? Macrobid? Or are the risks the same? Patient uses [facility] as her pharmacy.

## 2014-02-27 NOTE — Telephone Encounter (Signed)
Pt has questions about her medication  °

## 2014-02-28 MED ORDER — NITROFURANTOIN MONOHYD MACRO 100 MG PO CAPS: 100.0000 mg | ORAL_CAPSULE | Freq: Two times a day (BID) | ORAL | Status: DC

## 2014-02-28 NOTE — Telephone Encounter (Signed)
Rx for macrobid 100mg  bid for 7 days with RF sent to pharmacy.  Do not see any drug interactions between this and Zoloft.  Please notify pt.  Encounter closed.

## 2014-02-28 NOTE — Telephone Encounter (Signed)
Message left for patient at [phone] name confirmed on voicemail. Advised new prescription sent to pharmacy for Macrobid 100mg  for 7 days with RF. No drug interaction between this and current medication. Advised to call back with any further questions.  Encounter closed per previous message from [place].

## 2014-04-16 IMAGING — CR DG CHEST 2V
2 series · 2 of 2 positions shown · non-contrast
Comparison: None.

CLINICAL DATA: Dyspnea.

CHEST - 2 VIEW

[view not recorded (1 of 2)]
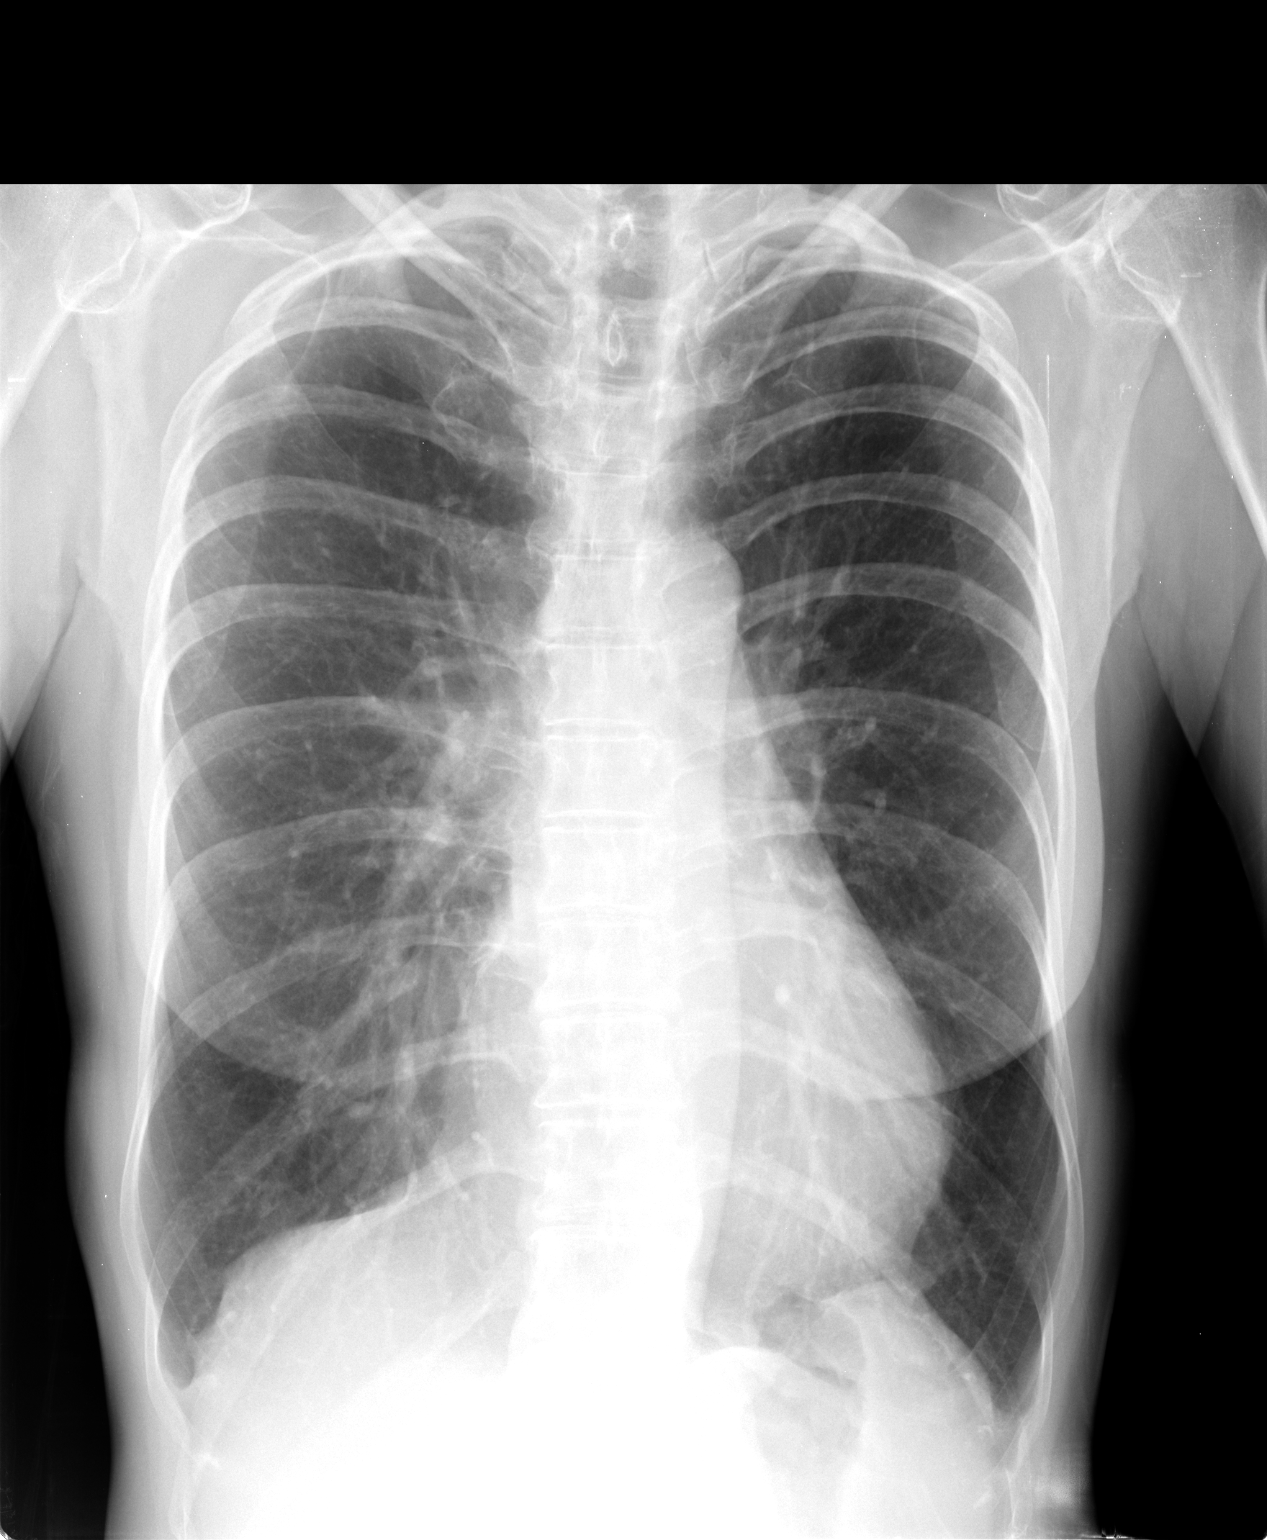

[view not recorded (2 of 2)]
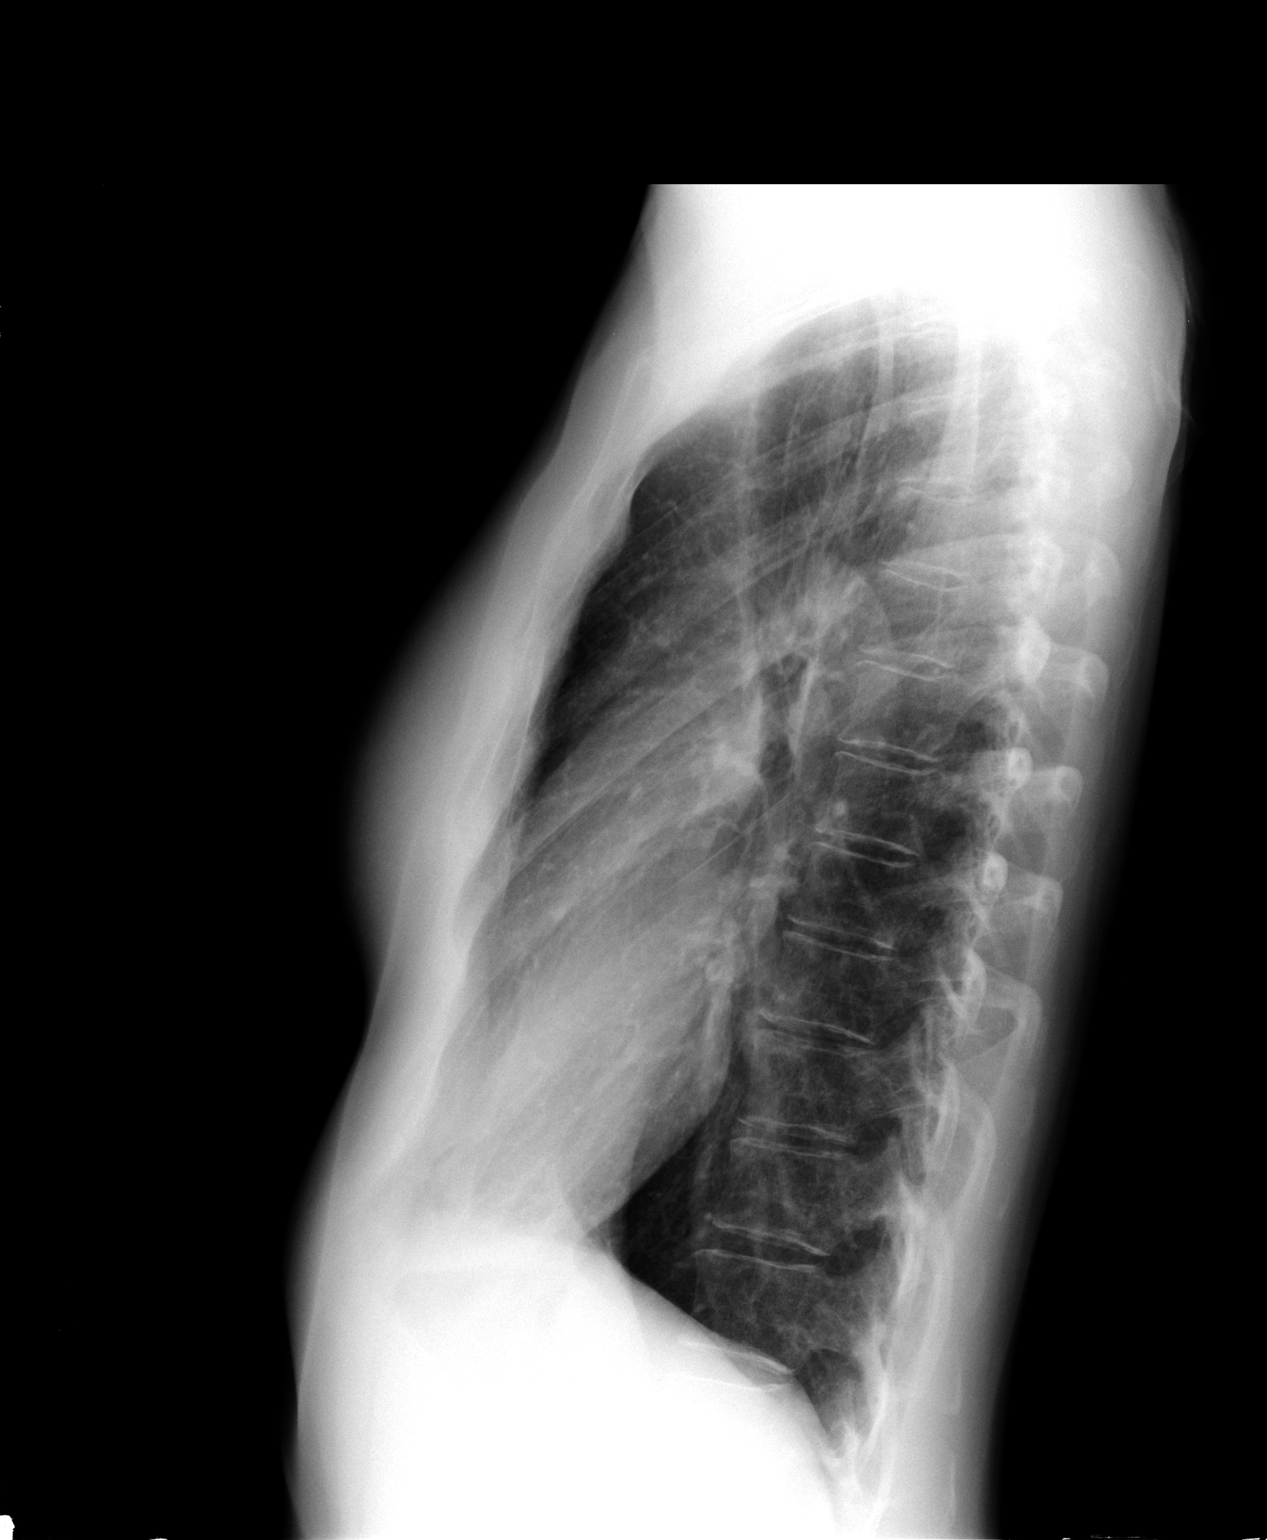

[2 of 2 positions shown; findings below may reference images not displayed]

FINDINGS: Cardiomediastinal silhouette appears normal.  No acute
pulmonary disease is noted.  Bony thorax is intact.
IMPRESSION: No acute cardiopulmonary abnormality seen.

## 2014-06-12 ENCOUNTER — Other Ambulatory Visit: Payer: Self-pay | Admitting: Dermatology

## 2014-08-01 ENCOUNTER — Telehealth: Payer: Self-pay

## 2014-08-01 NOTE — Telephone Encounter (Signed)
Lmtcb//kn 

## 2014-08-01 NOTE — Telephone Encounter (Signed)
Pt returning call

## 2014-08-04 NOTE — Telephone Encounter (Signed)
Patient notified of BMD results.//kn 

## 2014-08-04 NOTE — Telephone Encounter (Signed)
Patient notified of BMD results. Hard copy will be scanned in epic.//kn

## 2014-08-25 ENCOUNTER — Encounter: Payer: Self-pay | Admitting: Obstetrics & Gynecology

## 2014-09-11 ENCOUNTER — Other Ambulatory Visit: Payer: Self-pay | Admitting: Dermatology

## 2014-12-11 ENCOUNTER — Other Ambulatory Visit: Payer: Self-pay | Admitting: Dermatology

## 2015-01-01 ENCOUNTER — Ambulatory Visit (INDEPENDENT_AMBULATORY_CARE_PROVIDER_SITE_OTHER): Payer: BC Managed Care – PPO | Admitting: Obstetrics & Gynecology

## 2015-01-01 ENCOUNTER — Encounter: Payer: Self-pay | Admitting: Obstetrics & Gynecology

## 2015-01-01 VITALS — BP 102/62 | HR 64 | Resp 16 | Ht 64.75 in | Wt 110.2 lb

## 2015-01-01 DIAGNOSIS — Z0000 Encounter for general adult medical examination without abnormal findings: Secondary | ICD-10-CM | POA: Diagnosis not present

## 2015-01-01 DIAGNOSIS — Z01419 Encounter for gynecological examination (general) (routine) without abnormal findings: Secondary | ICD-10-CM

## 2015-01-01 DIAGNOSIS — Z9889 Other specified postprocedural states: Secondary | ICD-10-CM

## 2015-01-01 DIAGNOSIS — Z9079 Acquired absence of other genital organ(s): Secondary | ICD-10-CM | POA: Insufficient documentation

## 2015-01-01 DIAGNOSIS — Z90721 Acquired absence of ovaries, unilateral: Secondary | ICD-10-CM

## 2015-01-01 LAB — POCT URINALYSIS DIPSTICK
Bilirubin, UA: NEGATIVE
Blood, UA: NEGATIVE
Glucose, UA: NEGATIVE
Ketones, UA: NEGATIVE
Nitrite, UA: NEGATIVE
Protein, UA: NEGATIVE
Urobilinogen, UA: NEGATIVE
pH, UA: 5

## 2015-01-01 LAB — HEMOGLOBIN, FINGERSTICK: Hemoglobin, fingerstick: 14.6 g/dL (ref 12.0–16.0)

## 2015-01-01 MED ORDER — ESTROGENS, CONJUGATED 0.625 MG/GM VA CREA: 0.5000 g | TOPICAL_CREAM | VAGINAL | Status: DC

## 2015-01-01 NOTE — Progress Notes (Signed)
64 y.o. G3P3 MarriedCaucasianF here for annual exam.  Doing well.  Reports she is having some bone regression in jaw.  Being following by dentist right now.  No vaginal bleeding.    PCP:  Darcus Austin.  Had lab work a month ago.  TGs were almost 100 points higher than the year prior.    Traveling to [place] this summer.  Patient's last menstrual period was 03/25/2003.          Sexually active: Yes.    The current method of family planning is post menopausal status and BSO-2008    Exercising: Yes.    back, neck, and abdominal exercises, yoga, and bike 25-40 mins/daily Smoker:  no  Health Maintenance: Pap:  12/20/13-WNL History of abnormal Pap:  yes MMG:  07/24/14 3D-normal Colonoscopy:  2010-repeat in 10 years Dr Watt Climes BMD:   07/24/14-mild osteopenia TDaP:  2/15 Screening Labs: PCP, Hb today: 14.6, Urine today: WBC-trace   reports that she has never smoked. She has never used smokeless tobacco. She reports that she does not drink alcohol or use illicit drugs.  Past Medical History  Diagnosis Date  . Hyperlipemia     diet only treatment  . Allergic rhinitis   . Chronic headaches   . Canker sore   . Neuropathy 06/2009  . Asthma     mild    Past Surgical History  Procedure Laterality Date  . Breast fibroadenoma surgery  12/95  . Rhinoplasty  1977  . Tendon release  1996  . Bilateral salpingoophorectomy  4/08  . Oral surgeries      multiple-as a child  . Hand surgery      for cyst--Dr. Fredna Dow  . Mole removal      multiple    Current Outpatient Prescriptions  Medication Sig Dispense Refill  . albuterol (PROVENTIL HFA;VENTOLIN HFA) 108 (90 BASE) MCG/ACT inhaler Inhale 2 puffs into the lungs every 6 (six) hours as needed for wheezing or shortness of breath. 1 Inhaler 6  . b complex vitamins tablet Take 1 tablet by mouth daily.    . calcium-vitamin D (OSCAL WITH D) 500-200 MG-UNIT per tablet Take 1 tablet by mouth daily.     Marland Kitchen conjugated estrogens (PREMARIN) vaginal cream Place  5.63 Applicatorfuls vaginally 2 (two) times a week. 30 g 4  . Flaxseed, Linseed, (FLAX SEED OIL) 1000 MG CAPS Take 1 capsule by mouth daily.    . Garlic 875 MG TABS Take 1 tablet by mouth daily.    Marland Kitchen KRILL OIL PO Take by mouth. 500-1000mg  daily    . Lysine HCl 500 MG TABS Take 1 tablet by mouth 2 (two) times daily.    . Multiple Vitamin (MULTIVITAMIN) tablet Take 1 tablet by mouth daily.    Marland Kitchen pyridOXINE (VITAMIN B-6) 100 MG tablet Take 100 mg by mouth daily.    . sertraline (ZOLOFT) 50 MG tablet Take 50 mg by mouth daily.     No current facility-administered medications for this visit.    Family History  Problem Relation Age of Onset  . Pulmonary fibrosis Maternal Grandmother   . Pulmonary fibrosis Maternal Aunt   . Pulmonary fibrosis      mothers great aunt and great neice(cousin to patient)  . Heart failure Mother   . Atrial fibrillation Father     PAT    ROS:  Pertinent items are noted in HPI.  Otherwise, a comprehensive ROS was negative.  Exam:   BP 102/62 mmHg  Pulse 64  Resp 16  Ht 5' 4.75" (1.645 m)  Wt 110 lb 3.2 oz (49.986 kg)  BMI 18.47 kg/m2  LMP 03/25/2003  Weight change: -1#   Height: 5' 4.75" (164.5 cm)  Ht Readings from Last 3 Encounters:  01/01/15 5' 4.75" (1.645 m)  12/20/13 5' 4.5" (1.638 m)  11/29/12 5\' 5"  (1.651 m)    General appearance: alert, cooperative and appears stated age Head: Normocephalic, without obvious abnormality, atraumatic Neck: no adenopathy, supple, symmetrical, trachea midline and thyroid normal to inspection and palpation Lungs: clear to auscultation bilaterally Breasts: normal appearance, no masses or tenderness Heart: regular rate and rhythm Abdomen: soft, non-tender; bowel sounds normal; no masses,  no organomegaly Extremities: extremities normal, atraumatic, no cyanosis or edema Skin: Skin color, texture, turgor normal. No rashes or lesions Lymph nodes: Cervical, supraclavicular, and axillary nodes normal. No abnormal  inguinal nodes palpated Neurologic: Grossly normal   Pelvic: External genitalia:  no lesions              Urethra:  normal appearing urethra with no masses, tenderness or lesions              Bartholins and Skenes: normal                 Vagina: normal appearing vagina with normal color and discharge, no lesions              Cervix: no lesions              Pap taken: No. Bimanual Exam:  Uterus:  normal size, contour, position, consistency, mobility, non-tender              Adnexa: no mass, fullness, tenderness               Rectovaginal: Confirms               Anus:  normal sphincter tone, no lesions  Chaperone was present for exam.  A:  Well Woman with normal exam PMP, No HRT H/O Lap BSO 4/08 Vaginal atrophic changes Depression/anxiety H/O recurrent UTIs Latex allergy  P: Mammogram yearly. pap smear 2015.  No pap today. Premarin vaginal cream 1/2 gram vaginally twice weekly. #30 grams/4RF Pyridium 100mg  TID up to 7 days prn #21/3RF Cipro 500mg  BID x 7D #14/3RF.  D/W pt C diff colitis risk and to only use if needed and until symptoms resolved. Labs/vaccines with Dr. Inda Merlin return annually or prn

## 2015-03-30 ENCOUNTER — Other Ambulatory Visit: Payer: Self-pay | Admitting: Dermatology

## 2016-01-04 ENCOUNTER — Ambulatory Visit (INDEPENDENT_AMBULATORY_CARE_PROVIDER_SITE_OTHER): Payer: BC Managed Care – PPO

## 2016-01-04 DIAGNOSIS — R002 Palpitations: Secondary | ICD-10-CM

## 2016-03-04 ENCOUNTER — Encounter: Payer: Self-pay | Admitting: Obstetrics & Gynecology

## 2016-03-04 ENCOUNTER — Ambulatory Visit (INDEPENDENT_AMBULATORY_CARE_PROVIDER_SITE_OTHER): Payer: BC Managed Care – PPO | Admitting: Obstetrics & Gynecology

## 2016-03-04 VITALS — BP 110/60 | HR 60 | Resp 12 | Ht 64.25 in | Wt 107.2 lb

## 2016-03-04 DIAGNOSIS — Z01419 Encounter for gynecological examination (general) (routine) without abnormal findings: Secondary | ICD-10-CM

## 2016-03-04 DIAGNOSIS — Z0000 Encounter for general adult medical examination without abnormal findings: Secondary | ICD-10-CM

## 2016-03-04 DIAGNOSIS — Z205 Contact with and (suspected) exposure to viral hepatitis: Secondary | ICD-10-CM | POA: Diagnosis not present

## 2016-03-04 DIAGNOSIS — Z124 Encounter for screening for malignant neoplasm of cervix: Secondary | ICD-10-CM

## 2016-03-04 LAB — POCT URINALYSIS DIPSTICK
Bilirubin, UA: NEGATIVE
Blood, UA: NEGATIVE
Glucose, UA: NEGATIVE
Ketones, UA: NEGATIVE
Leukocytes, UA: NEGATIVE
Nitrite, UA: NEGATIVE
Protein, UA: NEGATIVE
Urobilinogen, UA: NEGATIVE
pH, UA: 5

## 2016-03-04 MED ORDER — ESTROGENS, CONJUGATED 0.625 MG/GM VA CREA: 0.5000 g | TOPICAL_CREAM | VAGINAL | Status: DC

## 2016-03-04 NOTE — Addendum Note (Signed)
Addended by: Karmen Bongo T on: 03/04/2016 01:33 PM   Modules accepted: Orders, SmartSet

## 2016-03-04 NOTE — Progress Notes (Signed)
65 y.o. G3P3 MarriedCaucasianF here for annual exam.  Doing well.  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  Denies vaginal bleeding.  Has done well from pulmonary standpoint this past year.  PCP:  Darcus Austin.  Has appt in Feb.  Not sure about Hep C testing.  Will do this today.  H/O elevated triglycerides last week with Dr. Inda Merlin.  On triglyceride lowering diet.  In the process, did lose weight.  Got under 100#.  Dr. Inda Merlin then wanted her to gain the weight back.  Pt reports she couldn't add any of the weight back with the low triglyceride diet.  So, she thinks her triglycerides are now elevated again.  Doesn't have blood work again until February.  Patient's last menstrual period was 03/25/2003.          Sexually active: Yes.    The current method of family planning is post menopausal status.    Exercising: Yes.    yoga, stationary bicycle, walking  Smoker:  no  Health Maintenance: Pap:  12/20/2013  Negative, 2012 neg pap with neg HR HPV History of abnormal Pap:  yes MMG:  07/30/2015 BIRADS 1 negative Colonoscopy:  2010 repeat 10 years BMD:   07/24/2014 mild osteopenia, repeat in 5 years TDaP:  2015 Screening Labs: Hep C testing, Hb today: with PCP, Urine today: with PCP   reports that she has never smoked. She has never used smokeless tobacco. She reports that she does not drink alcohol or use illicit drugs.  Past Medical History  Diagnosis Date  . Hyperlipemia     diet only treatment  . Allergic rhinitis   . Chronic headaches   . Canker sore   . Neuropathy 06/2009  . Asthma     mild    Past Surgical History  Procedure Laterality Date  . Breast fibroadenoma surgery  12/95  . Rhinoplasty  1977  . Tendon release  1996  . Bilateral salpingoophorectomy  4/08  . Oral surgeries      multiple-as a child  . Hand surgery      for cyst--Dr. Fredna Dow  . Mole removal      multiple    Current Outpatient Prescriptions  Medication Sig Dispense Refill  . albuterol (PROVENTIL  HFA;VENTOLIN HFA) 108 (90 BASE) MCG/ACT inhaler Inhale 2 puffs into the lungs every 6 (six) hours as needed for wheezing or shortness of breath. 1 Inhaler 6  . b complex vitamins tablet Take 1 tablet by mouth daily.    . calcium-vitamin D (OSCAL WITH D) 500-200 MG-UNIT per tablet Take 1 tablet by mouth daily.     Marland Kitchen conjugated estrogens (PREMARIN) vaginal cream Place [id number] Applicatorfuls vaginally 2 (two) times a week. 30 g 4  . Flaxseed, Linseed, (FLAX SEED OIL) 1000 MG CAPS Take 1 capsule by mouth daily.    . Garlic [id number] MG TABS Take 1 tablet by mouth daily.    Marland Kitchen KRILL OIL PO Take by mouth. 500-1000mg  daily    . Lysine HCl 500 MG TABS Take 1 tablet by mouth 2 (two) times daily.    . Multiple Vitamin (MULTIVITAMIN) tablet Take 1 tablet by mouth daily.    Marland Kitchen pyridOXINE (VITAMIN B-6) 100 MG tablet Take 100 mg by mouth daily.    . sertraline (ZOLOFT) 50 MG tablet Take 50 mg by mouth daily.     No current facility-administered medications for this visit.    Family History  Problem Relation Age of Onset  . Pulmonary fibrosis  Maternal Grandmother   . Pulmonary fibrosis Maternal Aunt   . Pulmonary fibrosis      mothers great aunt and great neice(cousin to patient)  . Heart failure Mother   . Atrial fibrillation Father     PAT    ROS:  Pertinent items are noted in HPI.  Otherwise, a comprehensive ROS was negative.  Exam:    General appearance: alert, cooperative and appears stated age Head: Normocephalic, without obvious abnormality, atraumatic Neck: no adenopathy, supple, symmetrical, trachea midline and thyroid normal to inspection and palpation Lungs: clear to auscultation bilaterally Breasts: normal appearance, no masses or tenderness Heart: regular rate and rhythm Abdomen: soft, non-tender; bowel sounds normal; no masses,  no organomegaly Extremities: extremities normal, atraumatic, no cyanosis or edema Skin: Skin color, texture, turgor normal. No rashes or lesions Lymph nodes:  Cervical, supraclavicular, and axillary nodes normal. No abnormal inguinal nodes palpated Neurologic: Grossly normal  Pelvic: External genitalia:  no lesions              Urethra:  normal appearing urethra with no masses, tenderness or lesions              Bartholins and Skenes: normal                 Vagina: normal appearing vagina with normal color and discharge, no lesions              Cervix: no lesions              Pap taken: Yes.   Bimanual Exam:  Uterus:  uterus absent              Adnexa: no mass, fullness, tenderness               Rectovaginal: Confirms               Anus:  normal sphincter tone, no lesions  Chaperone was present for exam.  A:  Well Woman with normal exam PMP, No HRT H/O Lap BSO 4/08 Vaginal atrophic changes Depression/anxiety H/O recurrent UTIs  P: Mammogram yearly. pap smear 2015. Pap and HR HPV today Hep C antibody Premarin vaginal cream 1/2 gram vaginally twice weekly. #30 grams/4RF Pyridium 100mg  TID up to 7 days prn #21/3RF Cipro 500mg  BID x 7D #14/3RF. Pt aware of c diff colitis risk. Other labs/vaccines with Dr. Inda Merlin return annually or prn

## 2016-03-05 LAB — HEPATITIS C ANTIBODY: HCV Ab: NEGATIVE

## 2016-03-08 LAB — IPS PAP TEST WITH HPV

## 2016-07-05 ENCOUNTER — Telehealth: Payer: Self-pay | Admitting: Obstetrics & Gynecology

## 2016-07-05 NOTE — Telephone Encounter (Signed)
Patient called and left a message at lunch. She said, "I have a mammogram on 08/02/16 at [facility] and I want to know if Dr. Sabra Heck wants me to have a bone density this year. If so, [facility] will need an order sent to them please."

## 2016-07-05 NOTE — Telephone Encounter (Deleted)
Dr. Sabra Heck,  Please advise.   Routing to provider for review.

## 2016-07-05 NOTE — Telephone Encounter (Signed)
Returned call to patient. Patient states that her PCP Dr. Inda Merlin had stated to the patient that it had been 2 years since her last BMD and she should have repeated. In review of BMD from 07/24/2014, Dr. Sabra Heck indicated patient would not need to repeat for 5 years. Patient requesting our office to let Dr. Inda Merlin know that she doesn't need to repeat for 5 years. Advised this message would be sent to Dr. Sabra Heck and our office would call with any additional recommendations.    Routing to provider for review.

## 2016-08-16 ENCOUNTER — Encounter: Payer: Self-pay | Admitting: Obstetrics & Gynecology

## 2016-08-22 ENCOUNTER — Telehealth: Payer: Self-pay | Admitting: *Deleted

## 2016-08-22 NOTE — Telephone Encounter (Signed)
She CAN do one in two years, I just don't think she NEEDs to do one in two years.  It's not a problem, it's just that she only has osteopenia and I think ok to go a little longer than two years between bone densities.  Hope that helps.

## 2016-08-22 NOTE — Telephone Encounter (Signed)
Call to patient. BMD results reviewed with patient and she verbalized understanding. Patient states she was told by [facility] and her PCP that she would need to do BMD scans every 2 years instead of 3-4. Patient just wants to make sure she was told correctly? RN advised this message would be sent to Dr. Sabra Heck for review and our office would call with any additional recommendations. Patient agreeable.   Routing to provider for review.

## 2016-08-26 NOTE — Telephone Encounter (Signed)
Message left per DPR with message from Dr. Sabra Heck as seen below. Instructed patient to call if she had any additional questions.   Routing to provider for final review. Patient agreeable to disposition. Will close encounter.

## 2017-01-12 ENCOUNTER — Telehealth: Payer: Self-pay | Admitting: Obstetrics & Gynecology

## 2017-01-12 NOTE — Telephone Encounter (Signed)
Spoke with patient. Patient states she has a dark spot on her perineum that she noticed a couple of days ago that she would like evaluated. Patient states she is unsure if the darkened area is sore because she has an open area beside it that is sore. Patient denies any discharge from area or swelling. Patient states she has had a lot of new mole lately and is not sure if the area is new since it is not an area that she looks at often. Patient scheduled for 01/13/17 at 10am with Dr. Sabra Heck. Patient is agreeable to date and time.  Routing to provider for final review. Patient is agreeable to disposition. Will close encounter.

## 2017-01-12 NOTE — Telephone Encounter (Signed)
Patient says she found a dark spot on her perineum that she would like check.

## 2017-01-13 ENCOUNTER — Ambulatory Visit (INDEPENDENT_AMBULATORY_CARE_PROVIDER_SITE_OTHER): Payer: Medicare Other | Admitting: Obstetrics & Gynecology

## 2017-01-13 ENCOUNTER — Encounter: Payer: Self-pay | Admitting: Obstetrics & Gynecology

## 2017-01-13 VITALS — BP 102/58 | HR 82 | Resp 18 | Wt 112.6 lb

## 2017-01-13 DIAGNOSIS — N9089 Other specified noninflammatory disorders of vulva and perineum: Secondary | ICD-10-CM | POA: Diagnosis not present

## 2017-01-13 NOTE — Patient Instructions (Signed)

## 2017-01-16 ENCOUNTER — Ambulatory Visit (INDEPENDENT_AMBULATORY_CARE_PROVIDER_SITE_OTHER): Payer: Medicare Other | Admitting: Obstetrics & Gynecology

## 2017-01-16 ENCOUNTER — Telehealth: Payer: Self-pay | Admitting: Obstetrics & Gynecology

## 2017-01-16 VITALS — BP 112/74 | HR 72 | Resp 14 | Wt 113.5 lb

## 2017-01-16 DIAGNOSIS — L292 Pruritus vulvae: Secondary | ICD-10-CM

## 2017-01-16 MED ORDER — TRIAMCINOLONE ACETONIDE 0.5 % EX OINT: 1.0000 "application " | TOPICAL_OINTMENT | Freq: Two times a day (BID) | CUTANEOUS | 0 refills | Status: DC

## 2017-01-16 MED ORDER — FLUCONAZOLE 150 MG PO TABS: 150.0000 mg | ORAL_TABLET | Freq: Once | ORAL | 0 refills | Status: DC

## 2017-01-16 NOTE — Telephone Encounter (Signed)
Spoke with patient. Patient states she had "mole removed" on perineum on 3/23 and she is unsure if she is experiencing infection or vaginitis. Patient states she is having vaginal itching, burning and milky vaginal discharge. Patient denies odor or urinary complaints Patient states symptoms have increased over the weekend. Patient reports "normal" swelling and redness at site. Patient reports swelling did decrease some with ice.  Patient states her sleep was disturbed last night d/t symptoms. Patient reports applying neosporin to site. Patient asking if she can use OTC vaginal cream or should I use something else for the symptoms? Recommended OV for further evaluation with Dr. Sabra Heck. Patient scheduled for 01/16/17 at 3:30pm. Patient is agreeable to date and time.   Routing to provider for final review. Patient is agreeable to disposition. Will close encounter.

## 2017-01-16 NOTE — Telephone Encounter (Signed)
Patient had a mole removed on Friday.  She is now experiencing some swelling and itching and a slight burning sensation.  Wants to know if she can get an over the counter cream to help.

## 2017-01-16 NOTE — Progress Notes (Signed)
GYNECOLOGY  VISIT   HPI: 66 y.o. G3P3 Married Caucasian female here for vulvar itching that started 01/15/16.  Pt was seen on 01/14/16 for a vulvar biopsy/excision of pigmented lesion. Pathology has not come back yet.  She reports that the itching started Saturday morning and now there is some associated sticky discharge as well.  No fevers.  No urinary symptoms.  No vaginal bleeding.  She almost used an OTC monistat but decided to come in and be seen.  Biopsy was on the left.  Itching is more on the right side.  GYNECOLOGIC HISTORY: Patient's last menstrual period was 03/25/2003. Contraception: PMP  Patient Active Problem List   Diagnosis Date Noted  . Palpitations 01/04/2016  . History of salpingoophorectomy 01/01/2015  . Asthma, exercise induced 11/09/2012    Past Medical History:  Diagnosis Date  . Allergic rhinitis   . Asthma    mild  . Canker sore   . Chronic headaches   . Hyperlipemia    diet only treatment  . Neuropathy ([place]) 06/2009  . Palpitations     Past Surgical History:  Procedure Laterality Date  . BILATERAL SALPINGOOPHORECTOMY  4/08  . BREAST FIBROADENOMA SURGERY  12/95  . HAND SURGERY     for cyst--Dr. Fredna Dow  . MOLE REMOVAL     multiple  . oral surgeries     multiple-as a child  . RHINOPLASTY  1977  . TENDON RELEASE  1996    MEDS:  Reviewed in EPIC and UTD  ALLERGIES: Adhesive [tape]; Latex; Sulfa antibiotics; Sulfamethoxazole-trimethoprim; and Red yeast rice [cholestin]  Family History  Problem Relation Age of Onset  . Pulmonary fibrosis Maternal Grandmother   . Heart failure Mother   . Atrial fibrillation Father     PAT  . Pulmonary fibrosis Maternal Aunt   . Pulmonary fibrosis      mothers great aunt and great neice(cousin to patient)    SH:  Married, non smoker  Review of Systems  Genitourinary:       Vulvar itching  All other systems reviewed and are negative.   PHYSICAL EXAMINATION:    BP 112/74 (BP Location: Right Arm, Patient  Position: Sitting, Cuff Size: Normal)   Pulse 72   Resp 14   Wt 113 lb 8 oz (51.5 kg)   LMP 03/25/2003   BMI 19.33 kg/m     General appearance: alert, cooperative and appears stated age Inguinal region: no enlarged lymph nodes  Pelvic: External genitalia:  no lesions, biopsy area without erythema, generalized erythema present within labia majore              Urethra:  normal appearing urethra with no masses, tenderness or lesions              Bartholins and Skenes: normal                 Vagina: normal appearing vagina with normal color, sticky discharge present just at vaginal opening, no lesions              Cervix: no lesions              Anus:  no lesions  Chaperone was present for exam.  Assessment: Vulvar itching s/p vulvar biopsy 3/23, pathology still pending  Plan: Diflucan 150mg  po x 1, repeat 72 hours.  #2/0RF Triamcinolone 0.5% ointment apply BID.  #30gm/0RF

## 2017-01-17 ENCOUNTER — Encounter: Payer: Self-pay | Admitting: Cardiology

## 2017-01-17 ENCOUNTER — Ambulatory Visit (INDEPENDENT_AMBULATORY_CARE_PROVIDER_SITE_OTHER): Payer: Medicare Other | Admitting: Cardiology

## 2017-01-17 VITALS — BP 100/72 | Ht 65.0 in | Wt 113.5 lb

## 2017-01-17 DIAGNOSIS — R002 Palpitations: Secondary | ICD-10-CM | POA: Diagnosis not present

## 2017-01-17 DIAGNOSIS — I493 Ventricular premature depolarization: Secondary | ICD-10-CM

## 2017-01-17 HISTORY — DX: Ventricular premature depolarization: I49.3

## 2017-01-17 LAB — BASIC METABOLIC PANEL
BUN/Creatinine Ratio: 30 — ABNORMAL HIGH (ref 12–28)
BUN: 21 mg/dL (ref 8–27)
CO2: 26 mmol/L (ref 18–29)
Calcium: 9.6 mg/dL (ref 8.7–10.3)
Chloride: 102 mmol/L (ref 96–106)
Creatinine, Ser: 0.7 mg/dL (ref 0.57–1.00)
GFR calc Af Amer: 105 mL/min/{1.73_m2} (ref >59)
GFR calc non Af Amer: 91 mL/min/{1.73_m2} (ref >59)
Glucose: 73 mg/dL (ref 65–99)
Potassium: 4.1 mmol/L (ref 3.5–5.2)
Sodium: 145 mmol/L — ABNORMAL HIGH (ref 134–144)

## 2017-01-17 LAB — MAGNESIUM: Magnesium: 2 mg/dL (ref 1.6–2.3)

## 2017-01-17 LAB — TSH: TSH: 1.76 u[IU]/mL (ref 0.450–4.500)

## 2017-01-17 NOTE — Patient Instructions (Signed)
Medication Instructions:  Your physician recommends that you continue on your current medications as directed. Please refer to the Current Medication list given to you today.   Labwork: TODAY: BMET, Mag, TSH  Testing/Procedures: Your physician has recommended that you wear a holter monitor. Holter monitors are medical devices that record the heart's electrical activity. Doctors most often use these monitors to diagnose arrhythmias. Arrhythmias are problems with the speed or rhythm of the heartbeat. The monitor is a small, portable device. You can wear one while you do your normal daily activities. This is usually used to diagnose what is causing palpitations/syncope (passing out).  Follow-Up: Your physician recommends that you schedule a follow-up appointment AS NEEDED with Dr. Radford Pax pending study results.  Any Other Special Instructions Will Be Listed Below (If Applicable).     If you need a refill on your cardiac medications before your next appointment, please call your pharmacy.

## 2017-01-17 NOTE — Progress Notes (Signed)
Cardiology Office Note    Date:  01/17/2017   ID:  Margaret [name], DOB 02-20-1951, MRN [id number]  PCP:  Marjorie Smolder, MD  Cardiologist:  Fransico Him, MD   Chief Complaint  Patient presents with  . Palpitations    History of Present Illness:  Margaret [name] is a 66 y.o. female with a history of hyperlipidemia and palpitations who is referred here by her PCP, Dr. Darcus Austin, for evaluation of palpitations.  She has a history of anxiety and is on Zoloft.  She has been diagnosed with PVCs in the past and is very aware of them.  She wore a Holter a year ago which showed PVCs and PACs.  She is under a lot of stress.  Her husband has gotten into some trouble which she worries about.  She says that her palpitations are noted mainly when she is laying in bed at night or sitting on the couch. She denies any dizziness or pre syncope.  She does have some heartburn but she denies any chest pain or pressure.  She has mild DOE secondary to asthma.  Occasionally at night she will have some problems with  LE edema She denies any dizziness or syncope.  She tries to avoid caffeine in general.  She tries to avoid OTC cold meds with stimulants in them.  Her mom had valvular CHF and her father had atrial arrhythmias and CHF.   Past Medical History:  Diagnosis Date  . Allergic rhinitis   . Asthma    mild  . Canker sore   . Chronic headaches   . Hyperlipemia    diet only treatment  . Neuropathy ([place]) 06/2009  . Palpitations   . PVC's (premature ventricular contractions) 01/17/2017    Past Surgical History:  Procedure Laterality Date  . BILATERAL SALPINGOOPHORECTOMY  4/08  . BREAST FIBROADENOMA SURGERY  12/95  . HAND SURGERY     for cyst--Dr. Fredna Dow  . MOLE REMOVAL     multiple  . oral surgeries     multiple-as a child  . RHINOPLASTY  1977  . TENDON RELEASE  1996    Current Medications: Current Meds  Medication Sig  . albuterol (PROVENTIL HFA;VENTOLIN HFA) 108 (90 BASE) MCG/ACT inhaler  Inhale 2 puffs into the lungs every 6 (six) hours as needed for wheezing or shortness of breath.  Marland Kitchen aspirin 81 MG tablet Take 81 mg by mouth daily.  Marland Kitchen b complex vitamins tablet Take 1 tablet by mouth daily.  Marland Kitchen conjugated estrogens (PREMARIN) vaginal cream Place 8.10 Applicatorfuls vaginally 2 (two) times a week.  . Flaxseed, Linseed, (FLAX SEED OIL) 1000 MG CAPS Take 1 capsule by mouth daily.  . Garlic 175 MG TABS Take 1 tablet by mouth daily.  Marland Kitchen KRILL OIL PO Take by mouth. 500-1000mg  daily  . Lysine HCl 500 MG TABS Take 1 tablet by mouth 2 (two) times daily.  . Multiple Vitamin (MULTIVITAMIN) tablet Take 1 tablet by mouth daily.  Marland Kitchen pyridOXINE (VITAMIN B-6) 100 MG tablet Take 100 mg by mouth daily.  . sertraline (ZOLOFT) 50 MG tablet Take 50 mg by mouth daily.  Marland Kitchen triamcinolone ointment (KENALOG) 0.5 % Apply 1 application topically 2 (two) times daily.    Allergies:   Adhesive [tape]; Latex; Sulfa antibiotics; Sulfamethoxazole-trimethoprim; and Red yeast rice [cholestin]   Social History   Social History  . Marital status: Married    Spouse name: N/A  . Number of children: N/A  . Years of education:  N/A   Occupational History  . retired    Social History Main Topics  . Smoking status: Never Smoker  . Smokeless tobacco: Never Used  . Alcohol use No  . Drug use: No  . Sexual activity: Yes    Partners: Male   Other Topics Concern  . Not on file   Social History Narrative  . No narrative on file     Family History:  The patient's family history includes Atrial fibrillation in her father; Heart failure in her mother; Pulmonary fibrosis in her maternal aunt and maternal grandmother.   ROS:   Please see the history of present illness.    ROS All other systems reviewed and are negative.  No flowsheet data found.     PHYSICAL EXAM:   VS:  BP 100/72   Ht 5\' 5"  (1.651 m)   Wt 113 lb 8 oz (51.5 kg)   LMP 03/25/2003   SpO2 98%   BMI 18.89 kg/m    GEN: Well nourished,  well developed, in no acute distress  HEENT: normal  Neck: no JVD, carotid bruits, or masses Cardiac:irregularly irregular and bradycardic; no murmurs, rubs, or gallops,no edema.  Intact distal pulses bilaterally.  Respiratory:  clear to auscultation bilaterally, normal work of breathing GI: soft, nontender, nondistended, + BS MS: no deformity or atrophy  Skin: warm and dry, no rash Neuro:  Alert and Oriented x 3, Strength and sensation are intact Psych: euthymic mood, full affect  Wt Readings from Last 3 Encounters:  01/17/17 113 lb 8 oz (51.5 kg)  01/16/17 113 lb 8 oz (51.5 kg)  01/13/17 112 lb 9.6 oz (51.1 kg)      Studies/Labs Reviewed:   EKG:  EKG is ordered today.  The ekg ordered today demonstrates NSR at 66bpm with no ST changes and rSR' in V1 c/w RV conduction delay  Recent Labs: No results found for requested labs within last 8760 hours.   Lipid Panel No results found for: CHOL, TRIG, HDL, CHOLHDL, VLDL, LDLCALC, LDLDIRECT  Additional studies/ records that were reviewed today include:  Office notes from PCP    ASSESSMENT:    1. Palpitations   2. PVC's (premature ventricular contractions)      PLAN:  In order of problems listed above:  1. Palpitations - apparently wore a holter monitor a year ago showing PACs and PVCs.  She says that her heart is beating a lot more irregular now and occurs almost every night.  I will get a Holter monitor to reassess.  I will check a TSH.  2. PVC/PACs - these seem to have increased in frequency.      Medication Adjustments/Labs and Tests Ordered: Current medicines are reviewed at length with the patient today.  Concerns regarding medicines are outlined above.  Medication changes, Labs and Tests ordered today are listed in the Patient Instructions below.  There are no Patient Instructions on file for this visit.   Signed, Fransico Him, MD  01/17/2017 9:31 AM    [address],  [place], [place]  [address] Phone: [phone]; Fax: [phone]

## 2017-01-20 NOTE — Progress Notes (Signed)
66 y.o. G3P3 MWF with new pigmented vulvar lesion that she recently noted.  She is fastidious about checking her skin.  She absolutely wants this removed today.  Denies itching or bleeding.  Denies tenderness.  Denies vaginal bleeding or discharge.    Past Medical History:  Diagnosis Date  . Allergic rhinitis   . Asthma    mild  . Canker sore   . Chronic headaches   . Hyperlipemia    diet only treatment  . Neuropathy ([place]) 06/2009  . Palpitations   . PVC's (premature ventricular contractions) 01/17/2017   Current Outpatient Prescriptions on File Prior to Visit  Medication Sig Dispense Refill  . albuterol (PROVENTIL HFA;VENTOLIN HFA) 108 (90 BASE) MCG/ACT inhaler Inhale 2 puffs into the lungs every 6 (six) hours as needed for wheezing or shortness of breath. 1 Inhaler 6  . aspirin 81 MG tablet Take 81 mg by mouth daily.    Marland Kitchen b complex vitamins tablet Take 1 tablet by mouth daily.    . calcium-vitamin D (OSCAL WITH D) 500-200 MG-UNIT per tablet Take 1 tablet by mouth daily.     Marland Kitchen conjugated estrogens (PREMARIN) vaginal cream Place 5.46 Applicatorfuls vaginally 2 (two) times a week. 30 g 4  . Flaxseed, Linseed, (FLAX SEED OIL) 1000 MG CAPS Take 1 capsule by mouth daily.    . Garlic 503 MG TABS Take 1 tablet by mouth daily.    Marland Kitchen KRILL OIL PO Take by mouth. 500-1000mg  daily    . Lysine HCl 500 MG TABS Take 1 tablet by mouth 2 (two) times daily.    . Multiple Vitamin (MULTIVITAMIN) tablet Take 1 tablet by mouth daily.    Marland Kitchen pyridOXINE (VITAMIN B-6) 100 MG tablet Take 100 mg by mouth daily.    . sertraline (ZOLOFT) 50 MG tablet Take 50 mg by mouth daily.     No current facility-administered medications on file prior to visit.    Review of Systems  All other systems reviewed and are negative.  Exam:   BP (!) 102/58 (BP Location: Right Arm, Patient Position: Sitting, Cuff Size: Normal)   Pulse 82   Resp 18   Wt 112 lb 9.6 oz (51.1 kg)   LMP 03/25/2003   BMI 19.18 kg/m    Physical Exam   Constitutional: She appears well-developed and well-nourished.  Abdominal: Soft. Bowel sounds are normal.  Genitourinary: Vagina normal.     Lymphadenopathy:       Right: No inguinal adenopathy present.       Left: No inguinal adenopathy present.  Skin: Skin is warm and dry.  Psychiatric: She has a normal mood and affect.   Procedure:  Area cleansed with Betadine.  Sterile technique used throughout procedure.  Skin anesthestized with Lidocaine 1% plain; 1.67mL.  4 punch biopsy used to obtain specimen.  Biopsy grasped with pick-ups and excised with scissors.  Adequate hemostasis obtained with silver nitrate sticks.  Dressing was not applied.  Pt tolerated procedure well.  Post procedure instructions provided.  Assessment:  Pigmented vulvar lesion   Plan:  Patient will be notified of pathology results via telephone per her request and additional recommendations will be made at that time.

## 2017-01-23 ENCOUNTER — Telehealth: Payer: Self-pay | Admitting: *Deleted

## 2017-01-23 NOTE — Telephone Encounter (Signed)
Message left to return call to Margaret [name] at [phone].    

## 2017-01-23 NOTE — Telephone Encounter (Signed)
-----   Message from Megan Salon, MD sent at 01/21/2017  8:51 AM EDT ----- Please let pt know the pathology showed a dysplastic nevus with some atypia.  This is not a melanoma.  However, there was one margin that wasn't fully excised so she needs to return for repeat excision.  Recheck 3 weeks for this as I want area to be fairly healed before repeating the excision.  Thanks.

## 2017-01-24 NOTE — Telephone Encounter (Signed)
Patient returning call.

## 2017-01-24 NOTE — Telephone Encounter (Signed)
Returned call to patient. Results reviewed with patient and she verbalized understanding. 3 week recheck and vulvar biopsy scheduled for Friday 02/03/17 at 0830. Patient agreeable to date and time of appointment.   Routing to provider for final review. Patient agreeable to disposition. Will close encounter.

## 2017-01-25 ENCOUNTER — Ambulatory Visit (INDEPENDENT_AMBULATORY_CARE_PROVIDER_SITE_OTHER): Payer: Medicare Other

## 2017-01-25 DIAGNOSIS — R002 Palpitations: Secondary | ICD-10-CM | POA: Diagnosis not present

## 2017-01-25 DIAGNOSIS — I493 Ventricular premature depolarization: Secondary | ICD-10-CM

## 2017-02-03 ENCOUNTER — Ambulatory Visit (INDEPENDENT_AMBULATORY_CARE_PROVIDER_SITE_OTHER): Payer: Medicare Other | Admitting: Obstetrics & Gynecology

## 2017-02-03 ENCOUNTER — Encounter: Payer: Self-pay | Admitting: Obstetrics & Gynecology

## 2017-02-03 VITALS — BP 96/60 | HR 78 | Resp 16 | Ht 65.0 in | Wt 112.0 lb

## 2017-02-03 DIAGNOSIS — D239 Other benign neoplasm of skin, unspecified: Secondary | ICD-10-CM

## 2017-02-03 MED ORDER — FLUCONAZOLE 150 MG PO TABS: 150.0000 mg | ORAL_TABLET | Freq: Once | ORAL | 0 refills | Status: AC

## 2017-02-03 NOTE — Progress Notes (Signed)
GYNECOLOGY  VISIT   HPI: 66 y.o. G3P3 Married Caucasian female here for re-excision of vulvar tissue due to dysplastic nevus with positive margin.  Pt aware as silver nitrate used for hemostasis with prior biopsy that pathology from tissue today may be totally negative.  Questions answered.  GYNECOLOGIC HISTORY: Patient's last menstrual period was 03/25/2003. Contraception: PMP Menopausal hormone therapy: none  Patient Active Problem List   Diagnosis Date Noted  . PVC's (premature ventricular contractions) 01/17/2017  . Palpitations 01/04/2016  . History of salpingoophorectomy 01/01/2015  . Asthma, exercise induced 11/09/2012    Past Medical History:  Diagnosis Date  . Allergic rhinitis   . Asthma    mild  . Canker sore   . Chronic headaches   . Hyperlipemia    diet only treatment  . Neuropathy ([place]) 06/2009  . Palpitations   . PVC's (premature ventricular contractions) 01/17/2017    Past Surgical History:  Procedure Laterality Date  . BILATERAL SALPINGOOPHORECTOMY  4/08  . BREAST FIBROADENOMA SURGERY  12/95  . HAND SURGERY     for cyst--Dr. Fredna Dow  . MOLE REMOVAL     multiple  . oral surgeries     multiple-as a child  . RHINOPLASTY  1977  . TENDON RELEASE  1996    MEDS:  Reviewed in EPIC and UTD  ALLERGIES: Adhesive [tape]; Latex; Sulfa antibiotics; Sulfamethoxazole-trimethoprim; and Red yeast rice [cholestin]  Family History  Problem Relation Age of Onset  . Pulmonary fibrosis Maternal Grandmother   . Heart failure Mother   . Atrial fibrillation Father     PAT  . Pulmonary fibrosis Maternal Aunt   . Pulmonary fibrosis      mothers great aunt and great neice(cousin to patient)    SH:  Married, non smoker  ROS  PHYSICAL EXAMINATION:    BP 96/60 (BP Location: Right Arm, Patient Position: Sitting, Cuff Size: Normal)   Pulse 78   Resp 16   Ht 5\' 5"  (1.651 m)   Wt 112 lb (50.8 kg)   LMP 03/25/2003   BMI 18.64 kg/m     General appearance: alert,  cooperative and appears stated age  Pelvic: External genitalia:  no lesions, area of prior biopsy site clearly noted and has healed well              Urethra:  normal appearing urethra with no masses, tenderness or lesions              Bartholins and Skenes: normal                  Procedure:  Area cleansed with betadine x 3.  1cc 1% Lidocaine instilled.  Lot:  [id number].  Exp:  2/21.  81mm punch biopsy used to remove additional tissue in same area.  Tissue removed with sterile scissors.  Silver nitrate used for excellent hemostasis.  Pt tolerated procedure well  Chaperone was present for exam.  Assessment: Dysplastis nevus, positive margin  Plan: re excision performed today

## 2017-02-08 ENCOUNTER — Telehealth: Payer: Self-pay

## 2017-02-08 NOTE — Telephone Encounter (Signed)
Patient returning call. Call home number [phone].

## 2017-02-08 NOTE — Telephone Encounter (Signed)
-----   Message from Megan Salon, MD sent at 02/07/2017  5:11 PM EDT ----- Please inform pt that her pathology was negative.  It did show the previous biopsy site but no further dysplastic tissue was noted.  No other excisions are needed.

## 2017-02-08 NOTE — Telephone Encounter (Signed)
Patient informed of pathology results. Patient verbalized understanding and agreement.

## 2017-02-08 NOTE — Telephone Encounter (Signed)
Left message for patient to call Margaret [name] back.  

## 2017-04-25 ENCOUNTER — Encounter: Payer: Self-pay | Admitting: Obstetrics & Gynecology

## 2017-04-25 ENCOUNTER — Ambulatory Visit (INDEPENDENT_AMBULATORY_CARE_PROVIDER_SITE_OTHER): Payer: Medicare Other | Admitting: Obstetrics & Gynecology

## 2017-04-25 VITALS — BP 96/58 | HR 66 | Resp 14 | Ht 64.25 in | Wt 110.4 lb

## 2017-04-25 DIAGNOSIS — Z01419 Encounter for gynecological examination (general) (routine) without abnormal findings: Secondary | ICD-10-CM | POA: Diagnosis not present

## 2017-04-25 MED ORDER — ESTROGENS, CONJUGATED 0.625 MG/GM VA CREA: TOPICAL_CREAM | VAGINAL | 3 refills | Status: DC

## 2017-04-25 MED ORDER — NITROFURANTOIN MONOHYD MACRO 100 MG PO CAPS: 100.0000 mg | ORAL_CAPSULE | Freq: Two times a day (BID) | ORAL | 1 refills | Status: DC

## 2017-04-25 MED ORDER — PHENAZOPYRIDINE HCL 97.2 MG PO TABS
97.0000 mg | ORAL_TABLET | Freq: Three times a day (TID) | ORAL | 1 refills | Status: DC | PRN
Start: 2017-04-25 — End: 2018-07-05

## 2017-04-25 NOTE — Progress Notes (Signed)
66 y.o. G3P3 Married Caucasian F here for annual exam.  Heading to [place] to a nephew's wedding in two weeks.  Has a lot traveling scheduled for next year.    Denies vaginal bleeding.  PCP:  Darcus Austin.  Blood work done in march.  Triglycerides were better.   Patient's last menstrual period was 03/25/2003.          Sexually active: Yes.    The current method of family planning is post menopausal status.    Exercising: Yes.    cycling, yoga, stretching Smoker:  no  Health Maintenance: Pap:  03/04/16 negative, HR HPV negative, 12/20/13 negative  History of abnormal Pap:  yes MMG:  08/02/16 BIRADS 1 negative  Colonoscopy:  2010- repeat 10 years.  Had some rectal bleeding this year so will do it again this year.  BMD:   08/02/16 osteopenia in hips- repeat 3-4 years  TDaP:  2015  Pneumonia vaccine(s):  10/08/13, 3/18 with Dr. Inda Merlin Zostavax:   12/23/11, started the Shingrix Hep C testing: 03/04/16 negative  Screening Labs: PCP- Dr. Inda Merlin, Hb today: same   reports that she has never smoked. She has never used smokeless tobacco. She reports that she does not drink alcohol or use drugs.  Past Medical History:  Diagnosis Date  . Allergic rhinitis   . Asthma    mild  . Canker sore   . Chronic headaches   . Hyperlipemia    diet only treatment  . Neuropathy 06/2009  . Palpitations   . PVC's (premature ventricular contractions) 01/17/2017    Past Surgical History:  Procedure Laterality Date  . BILATERAL SALPINGOOPHORECTOMY  4/08  . BREAST FIBROADENOMA SURGERY  12/95  . HAND SURGERY     for cyst--Dr. Fredna Dow  . MOLE REMOVAL     multiple  . oral surgeries     multiple-as a child  . RHINOPLASTY  1977  . TENDON RELEASE  1996    Current Outpatient Prescriptions  Medication Sig Dispense Refill  . albuterol (PROVENTIL HFA;VENTOLIN HFA) 108 (90 BASE) MCG/ACT inhaler Inhale 2 puffs into the lungs every 6 (six) hours as needed for wheezing or shortness of breath. 1 Inhaler 6  . ASPIRIN  PO Take 325 mg by mouth every other day.    . b complex vitamins tablet Take 1 tablet by mouth daily.    . calcium-vitamin D (OSCAL WITH D) 500-200 MG-UNIT per tablet Take 1 tablet by mouth daily.     Marland Kitchen conjugated estrogens (PREMARIN) vaginal cream Place 6.16 Applicatorfuls vaginally 2 (two) times a week. 30 g 4  . Flaxseed, Linseed, (FLAX SEED OIL) 1000 MG CAPS Take 1 capsule by mouth daily.    . Garlic 073 MG TABS Take 1 tablet by mouth daily.    Marland Kitchen KRILL OIL PO Take by mouth. 500-1000mg  daily    . Lysine HCl 500 MG TABS Take 1 tablet by mouth 2 (two) times daily.    . Multiple Vitamin (MULTIVITAMIN) tablet Take 1 tablet by mouth daily.    Marland Kitchen POTASSIUM PO Take 99 mg by mouth daily.    Marland Kitchen pyridOXINE (VITAMIN B-6) 100 MG tablet Take 100 mg by mouth daily.    . sertraline (ZOLOFT) 50 MG tablet Take 50 mg by mouth daily.    Marland Kitchen triamcinolone ointment (KENALOG) 0.5 % Apply 1 application topically 2 (two) times daily. 30 g 0   No current facility-administered medications for this visit.     Family History  Problem Relation Age of  Onset  . Pulmonary fibrosis Maternal Grandmother   . Heart failure Mother   . Atrial fibrillation Father        PAT  . Pulmonary fibrosis Maternal Aunt   . Pulmonary fibrosis Unknown        mothers great aunt and great neice(cousin to patient)    ROS:  Pertinent items are noted in HPI.  Otherwise, a comprehensive ROS was negative.  Exam:   BP (!) 96/58 (BP Location: Right Arm, Patient Position: Sitting, Cuff Size: Normal)   Pulse 66   Resp 14   Ht 5' 4.25" (1.632 m)   Wt 110 lb 6.4 oz (50.1 kg)   LMP 03/25/2003   BMI 18.80 kg/m    Height: 5' 4.25" (163.2 cm)  Ht Readings from Last 3 Encounters:  04/25/17 5' 4.25" (1.632 m)  02/03/17 5\' 5"  (1.651 m)  01/17/17 5\' 5"  (1.651 m)    General appearance: alert, cooperative and appears stated age Head: Normocephalic, without obvious abnormality, atraumatic Neck: no adenopathy, supple, symmetrical, trachea  midline and thyroid normal to inspection and palpation Lungs: clear to auscultation bilaterally Breasts: normal appearance, no masses or tenderness Heart: regular rate and rhythm Abdomen: soft, non-tender; bowel sounds normal; no masses,  no organomegaly Extremities: extremities normal, atraumatic, no cyanosis or edema Skin: Skin color, texture, turgor normal. No rashes or lesions Lymph nodes: Cervical, supraclavicular, and axillary nodes normal. No abnormal inguinal nodes palpated Neurologic: Grossly normal   Pelvic: External genitalia:  no lesions              Urethra:  normal appearing urethra with no masses, tenderness or lesions              Bartholins and Skenes: normal                 Vagina: normal appearing vagina with normal color and discharge, no lesions              Cervix: no lesions              Pap taken: No. Bimanual Exam:  Uterus:  normal size, contour, position, consistency, mobility, non-tender              Adnexa: no mass, fullness, tenderness               Rectovaginal: Confirms               Anus:  normal sphincter tone, no lesions  Chaperone was present for exam.  A:  Well Woman with normal exam PMP, no HRT H/o laparoscopic BSO 4/08 H/o vignal atrophy H/O depression/anxiety H/O recurrent UTIs Elevated triglycerides  P:   Mammogram guidelines reviewed pap smear not indicated.  Done 2017. Premarin vaignal cream 1/2 gm vaginally twice weekly.  #30gm/3RF Rx for macrobid 100mg  bid x 5 days and pyridium 100mg  tid x 5 days prn.  #15/1RF.  Rx's given to pt. Labs with Dr. Inda Merlin earlier this year return annually or prn

## 2017-05-01 ENCOUNTER — Other Ambulatory Visit: Payer: Self-pay | Admitting: Dermatology

## 2017-06-16 ENCOUNTER — Ambulatory Visit: Payer: BC Managed Care – PPO | Admitting: Obstetrics & Gynecology

## 2017-08-24 ENCOUNTER — Encounter: Payer: Self-pay | Admitting: Obstetrics & Gynecology

## 2017-10-24 DIAGNOSIS — C4491 Basal cell carcinoma of skin, unspecified: Secondary | ICD-10-CM

## 2017-10-24 HISTORY — PX: MOHS SURGERY: SHX181

## 2017-10-24 HISTORY — DX: Basal cell carcinoma of skin, unspecified: C44.91

## 2018-03-09 ENCOUNTER — Telehealth: Payer: Self-pay | Admitting: Obstetrics & Gynecology

## 2018-03-09 NOTE — Telephone Encounter (Signed)
Patient called and stated that she expect prescription to be Cipro and when she went to pick it up it was a different medication. She said she has never had the new prescription and is worried that she might be allergic.

## 2018-03-09 NOTE — Telephone Encounter (Signed)
Patient is preparing to travel out of the country, usually takes Abx Cipro with her to have on hand if needed for UTI. Patient went to fill RX sent 04/2017, was advised Macrobid was on file. Patient states she has done well with Cipro in the past, is aware of risk with c-diff and colitis, has never had concerns in the past.   Denies any GYN or urinary symptoms.   Patient leaves in 10 days, requesting Cipro 500 mg Rx.   Advised will review with Dr. Sabra Heck and return call, patient agreeable.   Dr. Sabra Heck -please advise.

## 2018-03-12 MED ORDER — CIPROFLOXACIN HCL 500 MG PO TABS: 500.0000 mg | ORAL_TABLET | Freq: Two times a day (BID) | ORAL | 0 refills | Status: DC

## 2018-03-12 NOTE — Telephone Encounter (Signed)
macrobid is for recurrent UTIs.  Cipro 500mg  bid x 5 days can be used for traveler's diarrhea as well.  Rx has been completed.  Please let pt know.  Thanks.

## 2018-03-12 NOTE — Telephone Encounter (Signed)
Spoke with patient, advised as seen below per Dr. Sabra Heck. Patient thankful for return call and verbalizes understanding.

## 2018-07-05 ENCOUNTER — Encounter: Payer: Self-pay | Admitting: Obstetrics & Gynecology

## 2018-07-05 ENCOUNTER — Other Ambulatory Visit (HOSPITAL_COMMUNITY)
Admission: RE | Admit: 2018-07-05 | Discharge: 2018-07-05 | Disposition: A | Discharge status: Home / Self Care | Payer: Medicare Other | Source: Ambulatory Visit | Attending: Obstetrics & Gynecology | Admitting: Obstetrics & Gynecology

## 2018-07-05 ENCOUNTER — Ambulatory Visit (INDEPENDENT_AMBULATORY_CARE_PROVIDER_SITE_OTHER): Payer: Medicare Other | Admitting: Obstetrics & Gynecology

## 2018-07-05 ENCOUNTER — Encounter

## 2018-07-05 ENCOUNTER — Other Ambulatory Visit: Payer: Self-pay

## 2018-07-05 VITALS — BP 90/60 | HR 60 | Resp 16 | Ht 64.5 in | Wt 110.8 lb

## 2018-07-05 DIAGNOSIS — Z124 Encounter for screening for malignant neoplasm of cervix: Secondary | ICD-10-CM | POA: Insufficient documentation

## 2018-07-05 DIAGNOSIS — Z01419 Encounter for gynecological examination (general) (routine) without abnormal findings: Secondary | ICD-10-CM | POA: Diagnosis not present

## 2018-07-05 MED ORDER — PHENAZOPYRIDINE HCL 97.2 MG PO TABS: 97.0000 mg | ORAL_TABLET | Freq: Three times a day (TID) | ORAL | 1 refills | Status: DC | PRN

## 2018-07-05 MED ORDER — ESTROGENS, CONJUGATED 0.625 MG/GM VA CREA: TOPICAL_CREAM | VAGINAL | 3 refills | Status: DC

## 2018-07-05 MED ORDER — CIPROFLOXACIN HCL 500 MG PO TABS: 500.0000 mg | ORAL_TABLET | Freq: Two times a day (BID) | ORAL | 0 refills | Status: DC

## 2018-07-05 NOTE — Progress Notes (Signed)
67 y.o. G3P3 MarriedCaucasianF here for annual exam.  Had Mohs surgery on nose for BCC.  Does want a BMD this year.  Needs order.  Denies vaginal bleeding.  PCP:  Dr. Inda Merlin.  Did blood work in March.    Patient's last menstrual period was 03/25/2003.          Sexually active: Yes.    The current method of family planning is post menopausal status.    Exercising: Yes.    exercise bike, yoga, stretches Smoker:  no  Health Maintenance: Pap:  03/04/16 Neg. HR HPV:neg  History of abnormal Pap:  Yes, 1980's  MMG:  08/11/17 BIRADS2:benign. Has appt scheduled  Colonoscopy:  2018 Normal. F/u 10 years  BMD:   08/02/16 Osteopenia  TDaP:  2015 Pneumonia vaccine(s):  Has completed both Shingrix:   Completed x 2 Hep C testing: 03/04/16 Neg  Screening Labs: PCP   reports that she has never smoked. She has never used smokeless tobacco. She reports that she does not drink alcohol or use drugs.  Past Medical History:  Diagnosis Date  . Allergic rhinitis   . Asthma    mild  . Basal cell carcinoma (BCC) 2019   nose  . Canker sore   . Chronic headaches   . Hyperlipemia    diet only treatment  . Neuropathy 06/2009  . Palpitations   . PVC's (premature ventricular contractions) 01/17/2017    Past Surgical History:  Procedure Laterality Date  . BILATERAL SALPINGOOPHORECTOMY  4/08  . BREAST FIBROADENOMA SURGERY  12/95  . HAND SURGERY     for cyst--Dr. Fredna Dow  . MOHS SURGERY  2019   nose  . MOLE REMOVAL     multiple  . oral surgeries     multiple-as a child  . RHINOPLASTY  1977  . TENDON RELEASE  1996    Current Outpatient Medications  Medication Sig Dispense Refill  . albuterol (PROVENTIL HFA;VENTOLIN HFA) 108 (90 BASE) MCG/ACT inhaler Inhale 2 puffs into the lungs every 6 (six) hours as needed for wheezing or shortness of breath. 1 Inhaler 6  . ASPIRIN PO Take 325 mg by mouth every other day.    . b complex vitamins tablet Take 1 tablet by mouth daily.    . calcium-vitamin D (OSCAL  WITH D) 500-200 MG-UNIT per tablet Take 1 tablet by mouth daily.     . cholecalciferol (VITAMIN D) 1000 units tablet Take 1,000 Units by mouth daily.    Marland Kitchen conjugated estrogens (PREMARIN) vaginal cream 1/2 gram pv twice weekly 30 g 3  . Flaxseed, Linseed, (FLAX SEED OIL) 1000 MG CAPS Take 1 capsule by mouth daily.    Marland Kitchen KRILL OIL PO Take by mouth. 500-1000mg  daily    . Lysine HCl 500 MG TABS Take 1 tablet by mouth 2 (two) times daily.    . Multiple Vitamin (MULTIVITAMIN) tablet Take 1 tablet by mouth daily.    Marland Kitchen POTASSIUM PO Take 99 mg by mouth daily.    Marland Kitchen pyridOXINE (VITAMIN B-6) 100 MG tablet Take 100 mg by mouth daily.    . sertraline (ZOLOFT) 50 MG tablet Take 50 mg by mouth daily.     No current facility-administered medications for this visit.     Family History  Problem Relation Age of Onset  . Pulmonary fibrosis Maternal Grandmother   . Heart failure Mother   . Atrial fibrillation Father        PAT  . Pulmonary fibrosis Maternal Aunt   .  Pulmonary fibrosis Unknown        mothers great aunt and great neice(cousin to patient)    Review of Systems  Cardiovascular: Positive for palpitations.  Genitourinary:       Loss of urine spontaneously  Loss of urine with sneeze or cough   Skin: Positive for itching.  All other systems reviewed and are negative.   Exam:   BP 90/60 (BP Location: Right Arm, Patient Position: Sitting, Cuff Size: Normal)   Pulse 60   Resp 16   Ht 5' 4.5" (1.638 m)   Wt 110 lb 12.8 oz (50.3 kg)   LMP 03/25/2003   BMI 18.73 kg/m   Height:   Height: 5' 4.5" (163.8 cm)  Ht Readings from Last 3 Encounters:  07/05/18 5' 4.5" (1.638 m)  04/25/17 5' 4.25" (1.632 m)  02/03/17 5\' 5"  (1.651 m)    General appearance: alert, cooperative and appears stated age Head: Normocephalic, without obvious abnormality, atraumatic Neck: no adenopathy, supple, symmetrical, trachea midline and thyroid normal to inspection and palpation Lungs: clear to auscultation  bilaterally Breasts: normal appearance, no masses or tenderness Heart: regular rate and rhythm Abdomen: soft, non-tender; bowel sounds normal; no masses,  no organomegaly Extremities: extremities normal, atraumatic, no cyanosis or edema Skin: Skin color, texture, turgor normal. No rashes or lesions Lymph nodes: Cervical, supraclavicular, and axillary nodes normal. No abnormal inguinal nodes palpated Neurologic: Grossly normal   Pelvic: External genitalia:  no lesions              Urethra:  normal appearing urethra with no masses, tenderness or lesions              Bartholins and Skenes: normal                 Vagina: normal appearing vagina with normal color and discharge, no lesions              Cervix: no lesions              Pap taken: Yes.   Bimanual Exam:  Uterus:  normal size, contour, position, consistency, mobility, non-tender              Adnexa: no mass, fullness, tenderness               Rectovaginal: Confirms               Anus:  normal sphincter tone, no lesions  Chaperone was present for exam.  A:  Well Woman with normal exam PMP, no HRT H/O depression Vaginal atrophic changes H/O recurrent UTIs H/o BSO 4/08 H/o abnormal cholesterol, has not been able to tolerate treatments  P:   Mammogram guidelines reviewed pap smear obtained Rx for Cipro 500mg  bid x 5 days for upcoming travel.  Risks of C diff colitis reviewed. Rx for pyridium 97mg  tid as needed for dysuria RF for premarin vaginal cram 12/ grm pv twice weekly.  #30/3RF Order for BMD placed for pt.  She will call and change appt. return annually or prn

## 2018-07-09 LAB — CYTOLOGY - PAP: Diagnosis: NEGATIVE

## 2018-08-17 ENCOUNTER — Telehealth: Payer: Self-pay | Admitting: *Deleted

## 2018-08-17 NOTE — Telephone Encounter (Signed)
Left voicemail to call back re: BMD results. 

## 2018-08-20 NOTE — Telephone Encounter (Signed)
Second attempt to contact patient. Left voicemail to call back re: BMD results 

## 2018-08-20 NOTE — Telephone Encounter (Signed)
Patient returning call to Reina. °

## 2018-08-21 NOTE — Telephone Encounter (Signed)
Patient notified.  Report to scan.

## 2018-08-21 NOTE — Telephone Encounter (Signed)
Patient returning call to Reina. °

## 2018-08-23 ENCOUNTER — Encounter: Payer: Self-pay | Admitting: Obstetrics & Gynecology

## 2019-08-15 ENCOUNTER — Encounter: Payer: Self-pay | Admitting: Obstetrics & Gynecology

## 2019-09-30 ENCOUNTER — Other Ambulatory Visit: Payer: Self-pay | Admitting: Obstetrics & Gynecology

## 2019-09-30 NOTE — Telephone Encounter (Signed)
Medication refill request: Premarin  Last AEX:  07-05-18 SM  Next AEX: 11-07-19 Last MMG (if hormonal medication request): 08-15-2019 density B/BIRADS 1 negative  Refill authorized: Today, please advise.   Medication pended for #42.5g, 0RF. Please refill if appropriate.

## 2019-11-04 ENCOUNTER — Other Ambulatory Visit: Payer: Self-pay

## 2019-11-05 ENCOUNTER — Telehealth: Payer: Self-pay | Admitting: Obstetrics & Gynecology

## 2019-11-05 NOTE — Telephone Encounter (Signed)
Patient is asking if we are able to do titer labs during her annual exam visit? 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 She has had rubella, mumps, chicken pox, etc when she was little and did not receive vaccines.

## 2019-11-05 NOTE — Progress Notes (Signed)
69 y.o. G3P3 Married White or Caucasian female here for annual exam.  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  Needs titers drawn for a variety of childhood illnesses.  Orders have been placed.  Denies vaginal bleeding.  Patient's last menstrual period was 03/25/2003.          Sexually active: Yes.    The current method of family planning is post menopausal status.    Exercising: Yes.    walking and exercise bike, stretches Smoker:  no  Health Maintenance:  Pap:  07/05/2018-  Neg. HR HPV:neg  History of abnormal Pap:  Yes, 1980's  MMG:  08/15/19- BIRADS 1, Neg,/08/11/17 BIRADS2:benign. Colonoscopy:  2018 Normal. F/u 10 years  BMD:   08/02/18 Osteopenia, -1.7 TDaP:  06/25/2019 [facility] Pneumonia vaccine(s):  Has completed both Shingrix:   Completed x 2 Hep C testing: 03/04/16 Neg  Screening Labs: Here today   reports that she has never smoked. She has never used smokeless tobacco. She reports that she does not drink alcohol or use drugs.  Past Medical History:  Diagnosis Date  . Allergic rhinitis   . Asthma    mild  . Basal cell carcinoma (BCC) 2019   nose  . Canker sore   . Chronic headaches   . Hyperlipemia    diet only treatment  . Neuropathy 06/2009  . Palpitations   . PVC's (premature ventricular contractions) 01/17/2017    Past Surgical History:  Procedure Laterality Date  . BILATERAL SALPINGOOPHORECTOMY  4/08  . BREAST FIBROADENOMA SURGERY  12/95  . HAND SURGERY     for cyst--Dr. Fredna Dow  . MOHS SURGERY  2019   nose  . MOLE REMOVAL     multiple  . oral surgeries     multiple-as a child  . RHINOPLASTY  1977  . TENDON RELEASE  1996    Current Outpatient Medications  Medication Sig Dispense Refill  . albuterol (PROVENTIL HFA;VENTOLIN HFA) 108 (90 BASE) MCG/ACT inhaler Inhale 2 puffs into the lungs every 6 (six) hours as needed for wheezing or shortness of breath. 1 Inhaler 6  . ASPIRIN PO Take 325 mg by mouth every other day.    . b  complex vitamins tablet Take 1 tablet by mouth daily.    . calcium-vitamin D (OSCAL WITH D) 500-200 MG-UNIT per tablet Take 1 tablet by mouth daily.     . cholecalciferol (VITAMIN D) 1000 units tablet Take 1,000 Units by mouth daily.    . ciprofloxacin (CIPRO) 500 MG tablet Take 1 tablet (500 mg total) by mouth 2 (two) times daily. 10 tablet 0  . conjugated estrogens (PREMARIN) vaginal cream INSERT 1/2 GRAM VAGINALLY  2 TIMES PER WEEK 42.5 g 0  . Flaxseed, Linseed, (FLAX SEED OIL) 1000 MG CAPS Take 1 capsule by mouth daily.    Marland Kitchen KRILL OIL PO Take by mouth. 500-1035m daily    . Lysine HCl 500 MG TABS Take 1 tablet by mouth 2 (two) times daily.    . Multiple Vitamin (MULTIVITAMIN) tablet Take 1 tablet by mouth daily.    . phenazopyridine (PYRIDIUM) 97 MG tablet Take 1 tablet (97 mg total) by mouth 3 (three) times daily as needed for pain. 12 tablet 1  . POTASSIUM PO Take 99 mg by mouth daily.    .Marland KitchenpyridOXINE (VITAMIN B-6) 100 MG tablet Take 100 mg by mouth daily.    . sertraline (ZOLOFT) 50 MG tablet Take 50 mg by mouth daily.  No current facility-administered medications for this visit.    Family History  Problem Relation Age of Onset  . Pulmonary fibrosis Maternal Grandmother   . Heart failure Mother   . Atrial fibrillation Father        PAT  . Pulmonary fibrosis Maternal Aunt   . Pulmonary fibrosis Unknown        mothers great aunt and great neice(cousin to patient)    Review of Systems  Exam:   Vitals:   11/07/19 0854  BP: 110/65  Pulse: 60  Temp: (!) 95.4 F (35.2 C)    General appearance: alert, cooperative and appears stated age Head: Normocephalic, without obvious abnormality, atraumatic Neck: no adenopathy, supple, symmetrical, trachea midline and thyroid normal to inspection and palpation Lungs: clear to auscultation bilaterally Breasts: normal appearance, no masses or tenderness Heart: regular rate and rhythm Abdomen: soft, non-tender; bowel sounds normal; no  masses,  no organomegaly Extremities: extremities normal, atraumatic, no cyanosis or edema Skin: Skin color, texture, turgor normal. No rashes or lesions Lymph nodes: Cervical, supraclavicular, and axillary nodes normal. No abnormal inguinal nodes palpated Neurologic: Grossly normal   Pelvic: External genitalia:  no lesions              Urethra:  normal appearing urethra with no masses, tenderness or lesions              Bartholins and Skenes: normal                 Vagina: normal appearing vagina with normal color and discharge, no lesions              Cervix: no lesions              Pap taken: No. Bimanual Exam:  Uterus:  normal size, contour, position, consistency, mobility, non-tender              Adnexa: no mass, fullness, tenderness               Rectovaginal: Confirms               Anus:  normal sphincter tone, no lesions  Chaperone, Evern Core, RN, was present for exam.  A:  Well Woman with normal exam PMP, no HRT H/o depression Vaginal atrophic changes H/o recurrent UTIs H/o BSO 4/08 Elevated lipids, no on treatment  P:   Mammogram guidelines reviewed Pap neg 2019.  Not indicated today. Varicella titer, Hep b titer, and MMR titers obtained today RF for cipro 515m bid x 5 days.  Printed rx given for travel in early 2022. Pyridium 959mtid as needed for dysuria.  #15/1RF RF for premarin cream 1/2 pv twice weekly  #30/3RF Will have screening blood work done in March Return annually or prn

## 2019-11-05 NOTE — Telephone Encounter (Signed)
Spoke to pt.  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 Pt has AEX on 11/07/2019 with Dr Sabra Heck and would like them drawn then.  Will route to Dr Sabra Heck for lab orders and review. Will close encounter.

## 2019-11-06 ENCOUNTER — Other Ambulatory Visit: Payer: Self-pay | Admitting: Obstetrics & Gynecology

## 2019-11-06 DIAGNOSIS — Z0184 Encounter for antibody response examination: Secondary | ICD-10-CM

## 2019-11-06 NOTE — Telephone Encounter (Signed)
Orders placed for future lab work to test antibodies for Hep B vaccination, MMR and Varicella antibodies.  Encounter closed.

## 2019-11-07 ENCOUNTER — Encounter: Payer: Self-pay | Admitting: Obstetrics & Gynecology

## 2019-11-07 ENCOUNTER — Other Ambulatory Visit: Payer: Self-pay

## 2019-11-07 ENCOUNTER — Ambulatory Visit (INDEPENDENT_AMBULATORY_CARE_PROVIDER_SITE_OTHER): Payer: Medicare PPO | Admitting: Obstetrics & Gynecology

## 2019-11-07 VITALS — BP 110/65 | HR 60 | Temp 95.4°F | Ht 64.5 in | Wt 112.2 lb

## 2019-11-07 DIAGNOSIS — Z01419 Encounter for gynecological examination (general) (routine) without abnormal findings: Secondary | ICD-10-CM | POA: Diagnosis not present

## 2019-11-07 DIAGNOSIS — Z0184 Encounter for antibody response examination: Secondary | ICD-10-CM

## 2019-11-07 MED ORDER — PREMARIN 0.625 MG/GM VA CREA: TOPICAL_CREAM | VAGINAL | 3 refills | Status: DC

## 2019-11-07 MED ORDER — PHENAZOPYRIDINE HCL 97.2 MG PO TABS: 97.0000 mg | ORAL_TABLET | Freq: Three times a day (TID) | ORAL | 1 refills | Status: DC | PRN

## 2019-11-07 MED ORDER — CIPROFLOXACIN HCL 500 MG PO TABS: 500.0000 mg | ORAL_TABLET | Freq: Two times a day (BID) | ORAL | 0 refills | Status: DC

## 2019-11-08 LAB — HEPATITIS B SURFACE ANTIBODY,QUALITATIVE: Hep B Surface Ab, Qual: REACTIVE

## 2019-11-08 LAB — VARICELLA ZOSTER ANTIBODY, IGG: Varicella zoster IgG: >4000 index (ref >165)

## 2019-11-08 LAB — MEASLES/MUMPS/RUBELLA IMMUNITY
MUMPS ABS, IGG: >300 AU/mL (ref >10.9)
RUBEOLA AB, IGG: >300 AU/mL (ref >16.4)
Rubella Antibodies, IGG: 30.9 index (ref >0.99)

## 2020-01-23 ENCOUNTER — Encounter: Payer: Self-pay | Admitting: Podiatry

## 2020-01-23 ENCOUNTER — Other Ambulatory Visit: Payer: Self-pay

## 2020-01-23 ENCOUNTER — Ambulatory Visit (INDEPENDENT_AMBULATORY_CARE_PROVIDER_SITE_OTHER): Payer: Medicare PPO | Admitting: Podiatry

## 2020-01-23 VITALS — BP 134/74 | HR 62 | Temp 96.4°F

## 2020-01-23 DIAGNOSIS — B351 Tinea unguium: Secondary | ICD-10-CM

## 2020-01-23 DIAGNOSIS — L03031 Cellulitis of right toe: Secondary | ICD-10-CM | POA: Diagnosis not present

## 2020-01-23 NOTE — Progress Notes (Signed)
Subjective:   Patient ID: Margaret [name], female   DOB: 69 y.o.   MRN: [id number]   HPI Patient states there is yellowness of the right hallux and second nails and she is concerned about this had fungal culture indicating there was fungal infiltration and is interested in nail removal or other treatment to try to help with the fungal condition.  Patient does not smoke likes to be active   Review of Systems  All other systems reviewed and are negative.       Objective:  Physical Exam Vitals and nursing note reviewed.  Constitutional:      Appearance: She is well-developed.  Pulmonary:     Effort: Pulmonary effort is normal.  Musculoskeletal:        General: Normal range of motion.  Skin:    General: Skin is warm.  Neurological:     Mental Status: She is alert.     Neurovascular status intact muscle strength adequate range of motion within normal limits.  Patient is noted to have discoloration of the hallux and second nails right distal two thirds with no proximal edema erythema but patient does have history of paronychia but has not had one for 9 months.  They do not really bother her but at times the paronychia are bothersome     Assessment:  Probability for mycotic nail infection hallux second right hand paronychia infection which may be due to ingrown toenail     Plan:  H&P conditions reviewed and at this point we will get initiate laser I did discuss consideration for oral treatment but she does not want to do this due to liver studies and stress on her liver.  I explained this may or may not solve her problem she is willing to accept risk and is scheduled for laser therapy after education and I also discussed possibility for ingrown toenail correction in future

## 2020-02-07 ENCOUNTER — Other Ambulatory Visit: Payer: Self-pay

## 2020-02-07 ENCOUNTER — Telehealth: Payer: Self-pay | Admitting: Podiatry

## 2020-02-07 ENCOUNTER — Ambulatory Visit (INDEPENDENT_AMBULATORY_CARE_PROVIDER_SITE_OTHER): Payer: Medicare PPO | Admitting: *Deleted

## 2020-02-07 DIAGNOSIS — B351 Tinea unguium: Secondary | ICD-10-CM

## 2020-02-07 NOTE — Progress Notes (Signed)
Patient presents today for the 1st laser treatment. Diagnosed with mycotic nail infection by Dr. Paulla Dolly.   Toenail most affected 1st, 2nd and 3rd nails right. There is very minimal discoloration and no thickening of the nails at all.  All other systems are negative.  Nails were filed thin. Laser therapy was administered to 1st, 2nd, 3rd toenails right and patient tolerated the treatment well. All safety precautions were in place.   Patient is using a topical antifungal.   Follow up in 4 weeks for laser # 2.

## 2020-02-07 NOTE — Telephone Encounter (Signed)
I do have a question for you, its just not about my particular bill. I was referred to you from the person who answered the phone there. So, if you would please give me a call as soon as possible, I would appreciate it. My number is [phone]. Thank you very much for calling back in a timely manner. Bye bye

## 2020-02-07 NOTE — Patient Instructions (Signed)

## 2020-03-06 ENCOUNTER — Other Ambulatory Visit: Payer: Self-pay

## 2020-03-06 ENCOUNTER — Ambulatory Visit (INDEPENDENT_AMBULATORY_CARE_PROVIDER_SITE_OTHER): Payer: Medicare PPO | Admitting: *Deleted

## 2020-03-06 DIAGNOSIS — B351 Tinea unguium: Secondary | ICD-10-CM

## 2020-03-06 NOTE — Progress Notes (Signed)
Patient presents today for the 2nd laser treatment. Diagnosed with mycotic nail infection by Dr. Paulla Dolly.   Toenail most affected 1st, 2nd and 3rd nails right. There is very minimal discoloration and no thickening of the nails at all. There is some new growth at the base that appears to be clear.  All other systems are negative.  Nails were filed thin. Laser therapy was administered to 1st, 2nd, 3rd toenails right and patient tolerated the treatment well. All safety precautions were in place.   Patient is using a topical antifungal.   Follow up in 4 weeks for laser # 3.  ~Do one more laser next month and then wait 8 weeks between 3rd and 4th.

## 2020-04-03 ENCOUNTER — Ambulatory Visit (INDEPENDENT_AMBULATORY_CARE_PROVIDER_SITE_OTHER): Payer: Medicare PPO | Admitting: *Deleted

## 2020-04-03 ENCOUNTER — Other Ambulatory Visit: Payer: Self-pay

## 2020-04-03 DIAGNOSIS — B351 Tinea unguium: Secondary | ICD-10-CM

## 2020-04-03 NOTE — Progress Notes (Signed)
Patient presents today for the 3rd laser treatment. Diagnosed with mycotic nail infection by Dr. Paulla Dolly.   Toenail most affected 1st, 2nd and 3rd nails right. The nails are starting to show quite a bit of new growth at the base.  All other systems are negative.  Nails were filed thin. Laser therapy was administered to 1st, 2nd, 3rd toenails right and patient tolerated the treatment well. All safety precautions were in place.   Patient is using a topical antifungal.   Follow up in 8 weeks for laser # 4.  ~Next treatment should be the last one. Have her follow up with Dr. Paulla Dolly in 1 month following.

## 2020-04-06 ENCOUNTER — Other Ambulatory Visit: Payer: Medicare PPO

## 2020-04-10 ENCOUNTER — Other Ambulatory Visit: Payer: Medicare PPO

## 2020-05-15 ENCOUNTER — Other Ambulatory Visit: Payer: Medicare PPO

## 2020-05-26 DIAGNOSIS — M71349 Other bursal cyst, unspecified hand: Secondary | ICD-10-CM | POA: Diagnosis not present

## 2020-05-26 DIAGNOSIS — M71342 Other bursal cyst, left hand: Secondary | ICD-10-CM | POA: Diagnosis not present

## 2020-06-12 ENCOUNTER — Other Ambulatory Visit: Payer: Medicare PPO

## 2020-07-02 DIAGNOSIS — Z20822 Contact with and (suspected) exposure to covid-19: Secondary | ICD-10-CM | POA: Diagnosis not present

## 2020-07-03 ENCOUNTER — Other Ambulatory Visit: Payer: Medicare PPO

## 2020-07-07 DIAGNOSIS — M71342 Other bursal cyst, left hand: Secondary | ICD-10-CM | POA: Diagnosis not present

## 2020-07-13 DIAGNOSIS — Z20822 Contact with and (suspected) exposure to covid-19: Secondary | ICD-10-CM | POA: Diagnosis not present

## 2020-07-14 DIAGNOSIS — F411 Generalized anxiety disorder: Secondary | ICD-10-CM | POA: Diagnosis not present

## 2020-07-14 DIAGNOSIS — E782 Mixed hyperlipidemia: Secondary | ICD-10-CM | POA: Diagnosis not present

## 2020-07-14 DIAGNOSIS — Z0000 Encounter for general adult medical examination without abnormal findings: Secondary | ICD-10-CM | POA: Diagnosis not present

## 2020-07-16 DIAGNOSIS — Z882 Allergy status to sulfonamides status: Secondary | ICD-10-CM | POA: Diagnosis not present

## 2020-07-16 DIAGNOSIS — Z7982 Long term (current) use of aspirin: Secondary | ICD-10-CM | POA: Diagnosis not present

## 2020-07-16 DIAGNOSIS — I499 Cardiac arrhythmia, unspecified: Secondary | ICD-10-CM | POA: Diagnosis not present

## 2020-07-16 DIAGNOSIS — M1990 Unspecified osteoarthritis, unspecified site: Secondary | ICD-10-CM | POA: Diagnosis not present

## 2020-07-16 DIAGNOSIS — Z79890 Hormone replacement therapy (postmenopausal): Secondary | ICD-10-CM | POA: Diagnosis not present

## 2020-07-16 DIAGNOSIS — F329 Major depressive disorder, single episode, unspecified: Secondary | ICD-10-CM | POA: Diagnosis not present

## 2020-07-16 DIAGNOSIS — E785 Hyperlipidemia, unspecified: Secondary | ICD-10-CM | POA: Diagnosis not present

## 2020-07-16 DIAGNOSIS — F349 Persistent mood [affective] disorder, unspecified: Secondary | ICD-10-CM | POA: Diagnosis not present

## 2020-07-16 DIAGNOSIS — R0902 Hypoxemia: Secondary | ICD-10-CM | POA: Diagnosis not present

## 2020-07-16 DIAGNOSIS — R32 Unspecified urinary incontinence: Secondary | ICD-10-CM | POA: Diagnosis not present

## 2020-07-17 ENCOUNTER — Other Ambulatory Visit: Payer: Medicare PPO

## 2020-08-20 DIAGNOSIS — Z1231 Encounter for screening mammogram for malignant neoplasm of breast: Secondary | ICD-10-CM | POA: Diagnosis not present

## 2020-09-03 DIAGNOSIS — M71341 Other bursal cyst, right hand: Secondary | ICD-10-CM | POA: Diagnosis not present

## 2020-09-04 ENCOUNTER — Other Ambulatory Visit: Payer: Medicare PPO

## 2020-09-07 ENCOUNTER — Encounter: Payer: Self-pay | Admitting: Obstetrics & Gynecology

## 2020-09-25 DIAGNOSIS — Z20822 Contact with and (suspected) exposure to covid-19: Secondary | ICD-10-CM | POA: Diagnosis not present

## 2020-09-29 DIAGNOSIS — Q846 Other congenital malformations of nails: Secondary | ICD-10-CM | POA: Diagnosis not present

## 2020-10-06 DIAGNOSIS — M62838 Other muscle spasm: Secondary | ICD-10-CM | POA: Diagnosis not present

## 2020-10-14 ENCOUNTER — Other Ambulatory Visit: Payer: Self-pay

## 2020-10-14 ENCOUNTER — Encounter: Payer: Self-pay | Admitting: Podiatry

## 2020-10-14 ENCOUNTER — Ambulatory Visit (INDEPENDENT_AMBULATORY_CARE_PROVIDER_SITE_OTHER): Payer: Medicare PPO | Admitting: Podiatry

## 2020-10-14 DIAGNOSIS — L600 Ingrowing nail: Secondary | ICD-10-CM

## 2020-10-14 MED ORDER — NEOMYCIN-POLYMYXIN-HC 3.5-10000-1 OT SOLN: OTIC | 0 refills | Status: DC

## 2020-10-14 NOTE — Patient Instructions (Signed)

## 2020-10-14 NOTE — Progress Notes (Signed)
Subjective:   Patient ID: Margaret [name], female   DOB: 69 y.o.   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

## 2020-11-02 DIAGNOSIS — M62838 Other muscle spasm: Secondary | ICD-10-CM | POA: Diagnosis not present

## 2020-11-04 DIAGNOSIS — H524 Presbyopia: Secondary | ICD-10-CM | POA: Diagnosis not present

## 2020-11-04 DIAGNOSIS — H5213 Myopia, bilateral: Secondary | ICD-10-CM | POA: Diagnosis not present

## 2020-11-04 DIAGNOSIS — H2513 Age-related nuclear cataract, bilateral: Secondary | ICD-10-CM | POA: Diagnosis not present

## 2020-12-17 ENCOUNTER — Other Ambulatory Visit (HOSPITAL_COMMUNITY)
Admission: RE | Admit: 2020-12-17 | Discharge: 2020-12-17 | Disposition: A | Discharge status: Home / Self Care | Payer: Medicare PPO | Source: Ambulatory Visit | Attending: Obstetrics & Gynecology | Admitting: Obstetrics & Gynecology

## 2020-12-17 ENCOUNTER — Ambulatory Visit (INDEPENDENT_AMBULATORY_CARE_PROVIDER_SITE_OTHER): Payer: Medicare PPO | Admitting: Obstetrics & Gynecology

## 2020-12-17 ENCOUNTER — Encounter (HOSPITAL_BASED_OUTPATIENT_CLINIC_OR_DEPARTMENT_OTHER): Payer: Self-pay | Admitting: Obstetrics & Gynecology

## 2020-12-17 ENCOUNTER — Other Ambulatory Visit: Payer: Self-pay

## 2020-12-17 VITALS — BP 125/87 | HR 54 | Ht 64.5 in | Wt 116.2 lb

## 2020-12-17 DIAGNOSIS — Z124 Encounter for screening for malignant neoplasm of cervix: Secondary | ICD-10-CM

## 2020-12-17 DIAGNOSIS — Z87440 Personal history of urinary (tract) infections: Secondary | ICD-10-CM | POA: Diagnosis not present

## 2020-12-17 DIAGNOSIS — Z01419 Encounter for gynecological examination (general) (routine) without abnormal findings: Secondary | ICD-10-CM

## 2020-12-17 DIAGNOSIS — Z8659 Personal history of other mental and behavioral disorders: Secondary | ICD-10-CM

## 2020-12-17 DIAGNOSIS — Z90721 Acquired absence of ovaries, unilateral: Secondary | ICD-10-CM

## 2020-12-17 DIAGNOSIS — E785 Hyperlipidemia, unspecified: Secondary | ICD-10-CM

## 2020-12-17 DIAGNOSIS — Z9079 Acquired absence of other genital organ(s): Secondary | ICD-10-CM | POA: Diagnosis not present

## 2020-12-17 DIAGNOSIS — N952 Postmenopausal atrophic vaginitis: Secondary | ICD-10-CM

## 2020-12-17 MED ORDER — CIPROFLOXACIN HCL 500 MG PO TABS: 500.0000 mg | ORAL_TABLET | Freq: Two times a day (BID) | ORAL | 0 refills | Status: DC

## 2020-12-17 MED ORDER — PHENAZOPYRIDINE HCL 97.2 MG PO TABS: 97.0000 mg | ORAL_TABLET | Freq: Three times a day (TID) | ORAL | 1 refills | Status: DC | PRN

## 2020-12-17 MED ORDER — PREMARIN 0.625 MG/GM VA CREA: TOPICAL_CREAM | VAGINAL | 3 refills | Status: DC

## 2020-12-17 NOTE — Progress Notes (Signed)
70 y.o. G3P3 Married White or Caucasian female here for breast and pelvic exam.  Doing well.  Denies vaginal bleeding.  I am also following her for history of recurrent UTIs.  Hopefully she is going to [place], [place] and [place] for a 75th (now 77th) anniversary of [place].  Needs RF for cipro and pyridium for this trip.  She doesn't take unless feels like she is having a UTI.  Vaginal premarin cream has helped prevent UTIs.  Last one was about 4 years ago.  Patient's last menstrual period was 03/25/2003.          Sexually active: Yes.    H/O STD:  no  Health Maintenance: PCP:  Dr. Lindell Noe.  Last wellness appt was 11/11/2020.  Did blood work at that appt:  No but will have blood work done in April Vaccines are up to date:  yes Colonoscopy:  2018 MMG:  08/20/2020 BMD:  07/2018, -1.7 Last pap smear:  06/2018   H/o abnormal pap smear:  no   reports that she has never smoked. She has never used smokeless tobacco. She reports that she does not drink alcohol and does not use drugs.  Past Medical History:  Diagnosis Date  . Allergic rhinitis   . Asthma    mild  . Basal cell carcinoma (BCC) 2019   nose  . Canker sore   . Chronic headaches   . Hyperlipemia    diet only treatment  . Palpitations   . PVC's (premature ventricular contractions) 01/17/2017    Past Surgical History:  Procedure Laterality Date  . BILATERAL SALPINGOOPHORECTOMY  4/08  . BREAST FIBROADENOMA SURGERY  12/95  . HAND SURGERY     for cyst--Dr. Fredna Dow  . MOHS SURGERY  2019   nose  . MOLE REMOVAL     multiple  . oral surgeries     multiple-as a child  . RHINOPLASTY  1977  . TENDON RELEASE  1996    Current Outpatient Medications  Medication Sig Dispense Refill  . aspirin 81 MG EC tablet 1 tablet    . b complex vitamins tablet Take 1 tablet by mouth daily.    . Calcium Carb-Cholecalciferol (CALCIUM 500 + D) 500-200 MG-UNIT TABS 1 tablet with a meal    . cholecalciferol (VITAMIN D) 1000 units tablet Take 2,000  Units by mouth daily.     . Ciclopirox 0.77 % gel     . conjugated estrogens (PREMARIN) vaginal cream INSERT 1/2 GRAM VAGINALLY  2 TIMES PER WEEK 42.5 g 3  . L-Lysine 1000 MG TABS 1 tablet    . magnesium 30 MG tablet Take 30 mg by mouth 2 (two) times daily.    . Multiple Vitamin (MULTIVITAMIN) tablet Take 1 tablet by mouth daily.    Marland Kitchen POTASSIUM PO Take 99 mg by mouth daily.    Marland Kitchen pyridOXINE (VITAMIN B-6) 100 MG tablet Take 100 mg by mouth daily.    . rosuvastatin (CRESTOR) 5 MG tablet     . sertraline (ZOLOFT) 50 MG tablet     . Turmeric 500 MG CAPS Take 500 mg by mouth 2 (two) times daily.    Marland Kitchen albuterol (PROVENTIL HFA;VENTOLIN HFA) 108 (90 BASE) MCG/ACT inhaler Inhale 2 puffs into the lungs every 6 (six) hours as needed for wheezing or shortness of breath. (Patient not taking: Reported on 12/17/2020) 1 Inhaler 6  . phenazopyridine (PYRIDIUM) 97 MG tablet Take 1 tablet (97 mg total) by mouth 3 (three) times daily as needed  for pain. (Patient not taking: Reported on 12/17/2020) 12 tablet 1  . triamcinolone cream (KENALOG) 0.5 % 1 application to affected area (Patient not taking: Reported on 12/17/2020)     No current facility-administered medications for this visit.    Family History  Problem Relation Age of Onset  . Pulmonary fibrosis Maternal Grandmother   . Heart failure Mother   . Atrial fibrillation Father        PAT  . Pulmonary fibrosis Maternal Aunt   . Pulmonary fibrosis Other        mothers great aunt and great neice(cousin to patient)    Review of Systems  All other systems reviewed and are negative.   Exam:   BP 125/87   Pulse (!) 54   Ht 5' 4.5" (1.638 m)   Wt 116 lb 3.2 oz (52.7 kg)   LMP 03/25/2003   BMI 19.64 kg/m   Height: 5' 4.5" (163.8 cm)  General appearance: alert, cooperative and appears stated age Breasts: normal appearance, no masses or tenderness Abdomen: soft, non-tender; bowel sounds normal; no masses,  no organomegaly Lymph nodes: Cervical,  supraclavicular, and axillary nodes normal.  No abnormal inguinal nodes palpated Neurologic: Grossly normal  Pelvic: External genitalia:  no lesions              Urethra:  normal appearing urethra with no masses, tenderness or lesions              Bartholins and Skenes: normal                 Vagina: normal appearing vagina with normal color and discharge, no lesions              Cervix: no lesions              Pap taken: Yes.   Bimanual Exam:  Uterus:  normal size, contour, position, consistency, mobility, non-tender              Adnexa: no mass, fullness, tenderness               Rectovaginal: Confirms               Anus:  normal sphincter tone, no lesions  Chaperone, Shela Nevin, RN, was present for exam.  Assessment/Plan: 1. Encntr for gyn exam (general) (routine) w/o abn findings - pap smear obtained today - MMG up to date - colonoscopy 2019, release will be signed today - BMD 2019.  Consider repeating this year or next year - vaccines up to date - lab work done with Dr. Lindell Noe  2. History of recurrent UTI (urinary tract infection) - phenazopyridine (PYRIDIUM) 97 MG tablet; Take 1 tablet (97 mg total) by mouth 3 (three) times daily as needed for pain.  Dispense: 12 tablet; Refill: 1 - ciprofloxacin (CIPRO) 500 MG tablet; Take 1 tablet (500 mg total) by mouth 2 (two) times daily.  Dispense: 10 tablet; Refill: 0  3. Vaginal atrophy - conjugated estrogens (PREMARIN) vaginal cream; INSERT 1/2 GRAM VAGINALLY  2 TIMES PER WEEK  Dispense: 42.5 g; Refill: 3  4. History of salping oophorectomy 4/08  5. Elevated lipids  6. History of depression  24 minutes of total time was spent for this patient encounter, including preparation, face-to-face counseling with the patient and coordination of care, and documentation of the encounter.

## 2020-12-18 LAB — CYTOLOGY - PAP: Diagnosis: NEGATIVE

## 2021-01-21 ENCOUNTER — Ambulatory Visit: Payer: Medicare PPO | Admitting: Obstetrics & Gynecology

## 2021-01-25 DIAGNOSIS — E782 Mixed hyperlipidemia: Secondary | ICD-10-CM | POA: Diagnosis not present

## 2021-01-25 DIAGNOSIS — J329 Chronic sinusitis, unspecified: Secondary | ICD-10-CM | POA: Diagnosis not present

## 2021-01-25 DIAGNOSIS — F411 Generalized anxiety disorder: Secondary | ICD-10-CM | POA: Diagnosis not present

## 2021-01-25 DIAGNOSIS — G629 Polyneuropathy, unspecified: Secondary | ICD-10-CM | POA: Diagnosis not present

## 2021-01-25 DIAGNOSIS — G43009 Migraine without aura, not intractable, without status migrainosus: Secondary | ICD-10-CM | POA: Diagnosis not present

## 2021-01-28 ENCOUNTER — Ambulatory Visit: Payer: Medicare PPO

## 2021-04-02 DIAGNOSIS — T148XXA Other injury of unspecified body region, initial encounter: Secondary | ICD-10-CM | POA: Diagnosis not present

## 2021-04-05 DIAGNOSIS — S31000D Unspecified open wound of lower back and pelvis without penetration into retroperitoneum, subsequent encounter: Secondary | ICD-10-CM | POA: Diagnosis not present

## 2021-04-20 DIAGNOSIS — R5383 Other fatigue: Secondary | ICD-10-CM | POA: Diagnosis not present

## 2021-04-20 DIAGNOSIS — R059 Cough, unspecified: Secondary | ICD-10-CM | POA: Diagnosis not present

## 2021-04-20 DIAGNOSIS — U071 COVID-19: Secondary | ICD-10-CM | POA: Diagnosis not present

## 2021-04-20 DIAGNOSIS — R0981 Nasal congestion: Secondary | ICD-10-CM | POA: Diagnosis not present

## 2021-05-31 DIAGNOSIS — D485 Neoplasm of uncertain behavior of skin: Secondary | ICD-10-CM | POA: Diagnosis not present

## 2021-05-31 DIAGNOSIS — L821 Other seborrheic keratosis: Secondary | ICD-10-CM | POA: Diagnosis not present

## 2021-05-31 DIAGNOSIS — L812 Freckles: Secondary | ICD-10-CM | POA: Diagnosis not present

## 2021-05-31 DIAGNOSIS — L820 Inflamed seborrheic keratosis: Secondary | ICD-10-CM | POA: Diagnosis not present

## 2021-06-29 DIAGNOSIS — L84 Corns and callosities: Secondary | ICD-10-CM | POA: Diagnosis not present

## 2021-06-29 DIAGNOSIS — D492 Neoplasm of unspecified behavior of bone, soft tissue, and skin: Secondary | ICD-10-CM | POA: Diagnosis not present

## 2021-06-29 DIAGNOSIS — Z08 Encounter for follow-up examination after completed treatment for malignant neoplasm: Secondary | ICD-10-CM | POA: Diagnosis not present

## 2021-06-29 DIAGNOSIS — Z85828 Personal history of other malignant neoplasm of skin: Secondary | ICD-10-CM | POA: Diagnosis not present

## 2021-06-29 DIAGNOSIS — L814 Other melanin hyperpigmentation: Secondary | ICD-10-CM | POA: Diagnosis not present

## 2021-06-29 DIAGNOSIS — D2271 Melanocytic nevi of right lower limb, including hip: Secondary | ICD-10-CM | POA: Diagnosis not present

## 2021-07-02 DIAGNOSIS — D492 Neoplasm of unspecified behavior of bone, soft tissue, and skin: Secondary | ICD-10-CM | POA: Diagnosis not present

## 2021-07-06 DIAGNOSIS — Z7982 Long term (current) use of aspirin: Secondary | ICD-10-CM | POA: Diagnosis not present

## 2021-07-06 DIAGNOSIS — E785 Hyperlipidemia, unspecified: Secondary | ICD-10-CM | POA: Diagnosis not present

## 2021-07-06 DIAGNOSIS — G8929 Other chronic pain: Secondary | ICD-10-CM | POA: Diagnosis not present

## 2021-07-06 DIAGNOSIS — F3341 Major depressive disorder, recurrent, in partial remission: Secondary | ICD-10-CM | POA: Diagnosis not present

## 2021-07-06 DIAGNOSIS — N952 Postmenopausal atrophic vaginitis: Secondary | ICD-10-CM | POA: Diagnosis not present

## 2021-07-06 DIAGNOSIS — R32 Unspecified urinary incontinence: Secondary | ICD-10-CM | POA: Diagnosis not present

## 2021-07-06 DIAGNOSIS — F411 Generalized anxiety disorder: Secondary | ICD-10-CM | POA: Diagnosis not present

## 2021-07-06 DIAGNOSIS — M8580 Other specified disorders of bone density and structure, unspecified site: Secondary | ICD-10-CM | POA: Diagnosis not present

## 2021-07-06 DIAGNOSIS — R030 Elevated blood-pressure reading, without diagnosis of hypertension: Secondary | ICD-10-CM | POA: Diagnosis not present

## 2021-07-26 DIAGNOSIS — Z0000 Encounter for general adult medical examination without abnormal findings: Secondary | ICD-10-CM | POA: Diagnosis not present

## 2021-07-26 DIAGNOSIS — F411 Generalized anxiety disorder: Secondary | ICD-10-CM | POA: Diagnosis not present

## 2021-07-26 DIAGNOSIS — E782 Mixed hyperlipidemia: Secondary | ICD-10-CM | POA: Diagnosis not present

## 2021-08-24 DIAGNOSIS — M85851 Other specified disorders of bone density and structure, right thigh: Secondary | ICD-10-CM | POA: Diagnosis not present

## 2021-08-24 DIAGNOSIS — M85852 Other specified disorders of bone density and structure, left thigh: Secondary | ICD-10-CM | POA: Diagnosis not present

## 2021-08-24 DIAGNOSIS — Z1231 Encounter for screening mammogram for malignant neoplasm of breast: Secondary | ICD-10-CM | POA: Diagnosis not present

## 2021-08-25 ENCOUNTER — Encounter: Payer: Self-pay | Admitting: Obstetrics & Gynecology

## 2021-08-31 ENCOUNTER — Encounter (HOSPITAL_BASED_OUTPATIENT_CLINIC_OR_DEPARTMENT_OTHER): Payer: Self-pay | Admitting: *Deleted

## 2021-10-27 ENCOUNTER — Telehealth (HOSPITAL_BASED_OUTPATIENT_CLINIC_OR_DEPARTMENT_OTHER): Payer: Self-pay | Admitting: Obstetrics & Gynecology

## 2021-10-27 NOTE — Telephone Encounter (Signed)
Called patient and left a message to call the office back to schedule the appointment .  

## 2021-10-28 ENCOUNTER — Other Ambulatory Visit (HOSPITAL_BASED_OUTPATIENT_CLINIC_OR_DEPARTMENT_OTHER): Payer: Self-pay | Admitting: Obstetrics & Gynecology

## 2021-10-28 ENCOUNTER — Encounter (HOSPITAL_BASED_OUTPATIENT_CLINIC_OR_DEPARTMENT_OTHER): Payer: Self-pay | Admitting: Obstetrics & Gynecology

## 2021-10-28 DIAGNOSIS — Z87440 Personal history of urinary (tract) infections: Secondary | ICD-10-CM

## 2021-10-28 MED ORDER — PHENAZOPYRIDINE HCL 100 MG PO TABS: 100.0000 mg | ORAL_TABLET | Freq: Three times a day (TID) | ORAL | 0 refills | Status: DC | PRN

## 2021-10-28 MED ORDER — CIPROFLOXACIN HCL 500 MG PO TABS: 500.0000 mg | ORAL_TABLET | Freq: Two times a day (BID) | ORAL | 0 refills | Status: DC

## 2021-10-29 ENCOUNTER — Other Ambulatory Visit (HOSPITAL_BASED_OUTPATIENT_CLINIC_OR_DEPARTMENT_OTHER): Payer: Self-pay | Admitting: *Deleted

## 2021-10-29 DIAGNOSIS — Z87440 Personal history of urinary (tract) infections: Secondary | ICD-10-CM

## 2021-10-29 DIAGNOSIS — N952 Postmenopausal atrophic vaginitis: Secondary | ICD-10-CM

## 2021-10-29 MED ORDER — CIPROFLOXACIN HCL 500 MG PO TABS: 500.0000 mg | ORAL_TABLET | Freq: Two times a day (BID) | ORAL | 0 refills | Status: DC

## 2021-10-29 MED ORDER — PREMARIN 0.625 MG/GM VA CREA: TOPICAL_CREAM | VAGINAL | 3 refills | Status: DC

## 2021-10-29 MED ORDER — PHENAZOPYRIDINE HCL 100 MG PO TABS: 100.0000 mg | ORAL_TABLET | Freq: Three times a day (TID) | ORAL | 0 refills | Status: DC | PRN

## 2021-11-08 DIAGNOSIS — H2513 Age-related nuclear cataract, bilateral: Secondary | ICD-10-CM | POA: Diagnosis not present

## 2021-11-08 DIAGNOSIS — H52203 Unspecified astigmatism, bilateral: Secondary | ICD-10-CM | POA: Diagnosis not present

## 2021-11-08 DIAGNOSIS — H5213 Myopia, bilateral: Secondary | ICD-10-CM | POA: Diagnosis not present

## 2021-11-11 DIAGNOSIS — Z0000 Encounter for general adult medical examination without abnormal findings: Secondary | ICD-10-CM | POA: Diagnosis not present

## 2021-12-14 DIAGNOSIS — Z811 Family history of alcohol abuse and dependence: Secondary | ICD-10-CM | POA: Diagnosis not present

## 2021-12-14 DIAGNOSIS — R32 Unspecified urinary incontinence: Secondary | ICD-10-CM | POA: Diagnosis not present

## 2021-12-14 DIAGNOSIS — E785 Hyperlipidemia, unspecified: Secondary | ICD-10-CM | POA: Diagnosis not present

## 2021-12-14 DIAGNOSIS — Z8249 Family history of ischemic heart disease and other diseases of the circulatory system: Secondary | ICD-10-CM | POA: Diagnosis not present

## 2021-12-14 DIAGNOSIS — I499 Cardiac arrhythmia, unspecified: Secondary | ICD-10-CM | POA: Diagnosis not present

## 2021-12-14 DIAGNOSIS — Z809 Family history of malignant neoplasm, unspecified: Secondary | ICD-10-CM | POA: Diagnosis not present

## 2021-12-14 DIAGNOSIS — Z823 Family history of stroke: Secondary | ICD-10-CM | POA: Diagnosis not present

## 2021-12-14 DIAGNOSIS — N951 Menopausal and female climacteric states: Secondary | ICD-10-CM | POA: Diagnosis not present

## 2021-12-14 DIAGNOSIS — G629 Polyneuropathy, unspecified: Secondary | ICD-10-CM | POA: Diagnosis not present

## 2021-12-24 ENCOUNTER — Ambulatory Visit (HOSPITAL_BASED_OUTPATIENT_CLINIC_OR_DEPARTMENT_OTHER): Payer: TRICARE For Life (TFL) | Admitting: Obstetrics & Gynecology

## 2021-12-31 DIAGNOSIS — L03032 Cellulitis of left toe: Secondary | ICD-10-CM | POA: Diagnosis not present

## 2022-02-10 DIAGNOSIS — R42 Dizziness and giddiness: Secondary | ICD-10-CM | POA: Diagnosis not present

## 2022-02-17 DIAGNOSIS — R42 Dizziness and giddiness: Secondary | ICD-10-CM | POA: Diagnosis not present

## 2022-02-23 ENCOUNTER — Encounter: Payer: Self-pay | Admitting: *Deleted

## 2022-02-23 ENCOUNTER — Ambulatory Visit (INDEPENDENT_AMBULATORY_CARE_PROVIDER_SITE_OTHER): Payer: Medicare PPO | Admitting: Cardiology

## 2022-02-23 VITALS — BP 108/72 | HR 63 | Ht 64.5 in | Wt 116.0 lb

## 2022-02-23 DIAGNOSIS — I493 Ventricular premature depolarization: Secondary | ICD-10-CM | POA: Diagnosis not present

## 2022-02-23 DIAGNOSIS — I471 Supraventricular tachycardia: Secondary | ICD-10-CM | POA: Diagnosis not present

## 2022-02-23 NOTE — Patient Instructions (Addendum)
Talk with your PCP about Xopenex ? ?Medication Instructions:  ?Your physician recommends that you continue on your current medications as directed. Please refer to the Current Medication list given to you today. ? ?*If you need a refill on your cardiac medications before your next appointment, please call your pharmacy* ? ?Testing/Procedures: ?Your physician has recommended that you wear an event monitor. Event monitors are medical devices that record the heart?s electrical activity. Doctors most often [place] these monitors to diagnose arrhythmias. Arrhythmias are problems with the speed or rhythm of the heartbeat. The monitor is a small, portable device. You can wear one while you do your normal daily activities. This is usually used to diagnose what is causing palpitations/syncope (passing out). ? ?Follow-Up: ?At [facility], you and your health needs are our priority.  As part of our continuing mission to provide you with exceptional heart care, we have created designated Provider Care Teams.  These Care Teams include your primary Cardiologist (physician) and Advanced Practice Providers (APPs -  Physician Assistants and Nurse Practitioners) who all work together to provide you with the care you need, when you need it. ? ?Your next appointment:   ?1 year(s) ? ?The format for your next appointment:   ?In Person ? ?Provider:   ?Fransico Him, MD   ? ? ?ZIO XT- Long Term Monitor Instructions ? ?Your physician has requested you wear a ZIO patch monitor for 14 days.  ?This is a single patch monitor. [employer] supplies one patch monitor per enrollment. Additional ?stickers are not available. Please do not apply patch if you will be having a Nuclear Stress Test,  ?Echocardiogram, Cardiac CT, MRI, or Chest Xray during the period you would be wearing the  ?monitor. The patch cannot be worn during these tests. You cannot remove and re-apply the  ?ZIO XT patch monitor.  ?Your ZIO patch monitor will be mailed 3 day USPS to your  address on file. It may take 3-5 days  ?to receive your monitor after you have been enrolled.  ?Once you have received your monitor, please review the enclosed instructions. Your monitor  ?has already been registered assigning a specific monitor serial # to you. ? ?Billing and Patient Assistance Program Information ? ?We have supplied [employer] with any of your insurance information on file for billing purposes. ?[employer] offers a sliding scale Patient Assistance Program for patients that do not have  ?insurance, or whose insurance does not completely cover the cost of the ZIO monitor.  ?You must apply for the Patient Assistance Program to qualify for this discounted rate.  ?To apply, please call [employer] at [phone], select option 4, select option 2, ask to apply for  ?Patient Assistance Program. Theodore Demark will ask your household income, and how many people  ?are in your household. They will quote your out-of-pocket cost based on that information.  ?[employer] will also be able to set up a 30-month interest-free payment plan if needed. ? ?Applying the monitor ?  ?Shave hair from upper left chest.  ?Hold abrader disc by orange tab. Rub abrader in 40 strokes over the upper left chest as  ?indicated in your monitor instructions.  ?Clean area with 4 enclosed alcohol pads. Let dry.  ?Apply patch as indicated in monitor instructions. Patch will be placed under collarbone on left  ?side of chest with arrow pointing upward.  ?Rub patch adhesive wings for 2 minutes. Remove white label marked "1". Remove the white  ?label marked "2". Rub patch adhesive wings for 2  additional minutes.  ?While looking in a mirror, press and release button in center of patch. A small green light will  ?flash 3-4 times. This will be your only indicator that the monitor has been turned on.  ?Do not shower for the first 24 hours. You may shower after the first 24 hours.  ?Press the button if you feel a symptom. You will hear a small click. Record  Date, Time and  ?Symptom in the Patient Logbook.  ?When you are ready to remove the patch, follow instructions on the last 2 pages of Patient  ?Logbook. Stick patch monitor onto the last page of Patient Logbook.  ?Place Patient Logbook in the blue and white box. Use locking tab on box and tape box closed  ?securely. The blue and white box has prepaid postage on it. Please place it in the mailbox as  ?soon as possible. Your physician should have your test results approximately 7 days after the  ?monitor has been mailed back to [facility].  ?Call [facility] at [phone] if you have questions regarding  ?your ZIO XT patch monitor. Call them immediately if you see an orange light blinking on your  ?monitor.  ?If your monitor falls off in less than 4 days, contact our Monitor department at [phone].  ?If your monitor becomes loose or falls off after 4 days call [employer] at [phone] for  ?suggestions on securing your monitor ? ? ? ?

## 2022-02-23 NOTE — Progress Notes (Signed)
? ?Cardiology CONSULT Note   ? ?Date:  02/23/2022  ? ?ID:  Margaret [name], DOB [birth year], MRN [id number] ? ?PCP:  Glenis Smoker, MD  ?Cardiologist:  Fransico Him, MD  ? ?Chief Complaint  ?Patient presents with  ? New Patient (Initial Visit)  ?  PVCs and atrial tachycardia  ? ? ?History of Present Illness:  ?Margaret [name] is a 71 y.o. female who is being seen today for the evaluation of PACs and PVCs and reestablish cardiac care at the request of Glenis Smoker, *. ? ?This is a 71 year old female with a history of hyperlipidemia, palpitations with PVCs diagnosed on event monitor as well as PACs.  She was last seen by me in 2018 at which time she complained of recurrent palpitations and 48-hour Holter monitor showed nonsustained atrial tachycardia up to 7 beats in a row as well as occasional PVCs, bigeminal PVCs and PACs.  At that time her PACs only bothered her when she was laying down in bed at night but when she was up moving around they did not bother her and her blood pressure was soft so no CCB or BB were added.  She was lost to follow-up and her PCP is now referred her back. ? ?She is here today for followup and is doing well.  She tells me that her palpitations had been fairly well controlled and not like when she had them with COVID.  She still notices the palpitations at least once daily usually when she has been active and then sits down to rest or goes to bed.  She describes it as fluttering sensation.  There are no associated sx.  She has been eating more chocolate than usual.  She has chronic DOE related to exercise induced asthma.  Occasionally she will have some mild LE edema.  She denies any chest pain or pressure, PND, orthopnea or syncope. She is compliant with her meds and is tolerating meds with no SE.    ? ?Past Medical History:  ?Diagnosis Date  ? Allergic rhinitis   ? Anxiety   ? Asthma   ? mild  ? Basal cell carcinoma (BCC) 2019  ? nose  ? Canker sore   ? Chronic headaches   ? Colitis    ? Frequent PVCs   ? Hyperlipemia   ? diet only treatment  ? Insomnia   ? Migraines   ? Osteoarthritis   ? PAC (premature atrial contraction)   ? Palpitations   ? PVC's (premature ventricular contractions) 01/17/2017  ? Stress incontinence of urine   ? Tinnitus   ? TMJ (dislocation of temporomandibular joint)   ? ? ?Past Surgical History:  ?Procedure Laterality Date  ? BILATERAL SALPINGOOPHORECTOMY  4/08  ? BREAST FIBROADENOMA SURGERY  12/95  ? HAND SURGERY    ? for cyst--Dr. Fredna Dow  ? MOHS SURGERY  2019  ? nose  ? MOLE REMOVAL    ? multiple  ? oral surgeries    ? multiple-as a child  ? RHINOPLASTY  1977  ? TENDON RELEASE  1996  ? ? ?Current Medications: ?No outpatient medications have been marked as taking for the 02/23/22 encounter (Office Visit) with Sueanne Margarita, MD.  ? ? ?Allergies:   Adhesive [tape], Augmentin [amoxicillin-pot clavulanate], Latex, Sulfa antibiotics, Monascus purpureus went yeast, Niacin and related, and Red yeast rice [cholestin]  ? ?Social History  ? ?Socioeconomic History  ? Marital status: Married  ?  Spouse name: Not on file  ? Number of  children: Not on file  ? Years of education: Not on file  ? Highest education level: Not on file  ?Occupational History  ? Occupation: retired  ?Tobacco Use  ? Smoking status: Never  ? Smokeless tobacco: Never  ?Vaping Use  ? Vaping Use: Never used  ?Substance and Sexual Activity  ? Alcohol use: No  ? Drug use: No  ? Sexual activity: Not Currently  ?  Partners: Male  ?  Birth control/protection: Post-menopausal  ?Other Topics Concern  ? Not on file  ?Social History Narrative  ? Not on file  ? ?Social Determinants of Health  ? ?Financial Resource Strain: Not on file  ?Food Insecurity: Not on file  ?Transportation Needs: Not on file  ?Physical Activity: Not on file  ?Stress: Not on file  ?Social Connections: Not on file  ?  ? ?Family History:  The patient's family history includes Atrial fibrillation in her father; Heart failure in her mother; Pulmonary  fibrosis in her maternal aunt, maternal grandmother, and another family member.  ? ?ROS:   ?Please see the history of present illness.    ?ROS All other systems reviewed and are negative. ? ?   ? View : No data to display.  ?  ?  ?  ? ? ? ? ? ?PHYSICAL EXAM:   ?VS:  LMP 03/25/2003    ?GEN: Well nourished, well developed, in no acute distress  ?HEENT: normal  ?Neck: no JVD, carotid bruits, or masses ?Cardiac: RRR; no murmurs, rubs, or gallops,no edema.  Intact distal pulses bilaterally.  ?Respiratory:  clear to auscultation bilaterally, normal work of breathing ?GI: soft, nontender, nondistended, + BS ?MS: no deformity or atrophy  ?Skin: warm and dry, no rash ?Neuro:  Alert and Oriented x 3, Strength and sensation are intact ?Psych: euthymic mood, full affect ? ?Wt Readings from Last 3 Encounters:  ?12/17/20 116 lb 3.2 oz (52.7 kg)  ?11/07/19 112 lb 3.2 oz (50.9 kg)  ?07/05/18 110 lb 12.8 oz (50.3 kg)  ?  ? ? ?Studies/Labs Reviewed:  ? ?EKG:  EKG is ordered today.  The ekg ordered today demonstrates NSR with iRBBB ? ?Recent Labs: ?No results found for requested labs within last 8760 hours.  ? ?Lipid Panel ?No results found for: CHOL, TRIG, HDL, CHOLHDL, VLDL, LDLCALC, LDLDIRECT ? ?Additional studies/ records that were reviewed today include:  ?None ? ?ASSESSMENT:   ? ?1. PVC's (premature ventricular contractions)   ?2. PAT (paroxysmal atrial tachycardia) ([place])   ? ? ? ?PLAN:  ?In order of problems listed above: ? ?PVCs ?-Noted in the past on Holter monitoring but symptomatic only at nighttime and not during the day. ?-She has not been treated with BB or CCB in the past because of soft blood pressures and lack of significant symptoms ?-she is having her palpitations once daily now and I will repeat 2 week ziopatch to reassess for PVC load and rule out PAF ? ?2.  Nonsustained atrial tachycardia ?-Again only symptomatic at nighttime and noted on Holter monitor ?-Again as above no BB or CCB in the past because of soft  blood pressures and lack of significant symptoms ? ?Time Spent: ?20 minutes total time of encounter, including 15 minutes spent in face-to-face patient care on the date of this encounter. This time includes coordination of care and counseling regarding above mentioned problem list. Remainder of non-face-to-face time involved reviewing chart documents/testing relevant to the patient encounter and documentation in the medical record. I have independently reviewed documentation  from referring provider ? ?Medication Adjustments/Labs and Tests Ordered: ?Current medicines are reviewed at length with the patient today.  Concerns regarding medicines are outlined above.  Medication changes, Labs and Tests ordered today are listed in the Patient Instructions below. ? ?There are no Patient Instructions on file for this visit. ? ? ?Signed, ?Fransico Him, MD  ?02/23/2022 11:01 AM    ?[facility] ?[address], [place], [place]  [address] ?Phone: [phone]; Fax: [phone]  ? ?

## 2022-02-23 NOTE — Progress Notes (Signed)
Patient ID: Margaret [name], female   DOB: 07/15/1951, 71 y.o.   MRN: [id number] ?Patient enrolled for [employer] to ship a 14 day cardiac event monitor with 2 Bridges and 6 packages of 5 76M Red Dot Repositionable electrodes to her address on file. ?

## 2022-02-23 NOTE — Addendum Note (Signed)
Addended by: Antonieta Iba on: 02/23/2022 11:44 AM ? ? Modules accepted: Orders ? ?

## 2022-02-23 NOTE — Addendum Note (Signed)
Addended by: Antonieta Iba on: 02/23/2022 11:49 AM ? ? Modules accepted: Orders ? ?

## 2022-03-09 ENCOUNTER — Ambulatory Visit: Payer: Medicare PPO | Attending: Family Medicine

## 2022-03-09 DIAGNOSIS — R2681 Unsteadiness on feet: Secondary | ICD-10-CM | POA: Insufficient documentation

## 2022-03-09 DIAGNOSIS — R42 Dizziness and giddiness: Secondary | ICD-10-CM | POA: Insufficient documentation

## 2022-03-09 NOTE — Therapy (Signed)
?OUTPATIENT PHYSICAL THERAPY NEURO EVALUATION ? ? ?Patient Name: Margaret [name] ?MRN: [id number] ?DOB:1951-08-07, 71 y.o., female ?Today's Date: 03/09/2022 ? ?PCP: Glenis Smoker, MD  ?REFERRING PROVIDER: [phone] (ICD-10-CM) - Other migraine, not intractable, without status migrainosus R42 (ICD-10-CM) - Vertigo  ? ? PT End of Session - 03/09/22 1316   ? ? Visit Number 1   ? Number of Visits 8   ? Date for PT Re-Evaluation 06/01/22   ? Authorization Type [employer] Medicare/Tricare 2023   ? Authorization Time Period auth required-Cohere   ? PT Start Time 4163   ? PT Stop Time 1400   ? PT Time Calculation (min) 43 min   ? Activity Tolerance Patient tolerated treatment well   ? Behavior During Therapy [facility] for tasks assessed/performed   ? ?  ?  ? ?  ? ? ?Past Medical History:  ?Diagnosis Date  ? Allergic rhinitis   ? Anxiety   ? Asthma   ? mild  ? Basal cell carcinoma (BCC) 2019  ? nose  ? Canker sore   ? Chronic headaches   ? Colitis   ? Frequent PVCs   ? Hyperlipemia   ? diet only treatment  ? Insomnia   ? Migraines   ? Osteoarthritis   ? PAC (premature atrial contraction)   ? Palpitations   ? PVC's (premature ventricular contractions) 01/17/2017  ? Stress incontinence of urine   ? Tinnitus   ? TMJ (dislocation of temporomandibular joint)   ? ?Past Surgical History:  ?Procedure Laterality Date  ? BILATERAL SALPINGOOPHORECTOMY  4/08  ? BREAST FIBROADENOMA SURGERY  12/95  ? HAND SURGERY    ? for cyst--Dr. Fredna Dow  ? MOHS SURGERY  2019  ? nose  ? MOLE REMOVAL    ? multiple  ? oral surgeries    ? multiple-as a child  ? RHINOPLASTY  1977  ? TENDON RELEASE  1996  ? ?Patient Active Problem List  ? Diagnosis Date Noted  ? PVC's (premature ventricular contractions) 01/17/2017  ? Palpitations 01/04/2016  ? History of salpingoophorectomy 01/01/2015  ? Asthma, exercise induced 11/09/2012  ? ? ?OUTPATIENT PHYSICAL THERAPY VESTIBULAR EVALUATION ? ? ? ? ?Patient Name: Margaret [name] ?MRN: [id number] ?DOB:04/18/1951, 71 y.o., female ?Today's  Date: 03/09/2022 ? ?PCP: Glenis Smoker, MD  ?REFERRING PROVIDER: Glenis Smoker, MD  ? ? PT End of Session - 03/09/22 1316   ? ? Visit Number 1   ? Number of Visits 8   ? Date for PT Re-Evaluation 06/01/22   ? Authorization Type [employer] Medicare/Tricare 2023   ? Authorization Time Period auth required-Cohere   ? PT Start Time 3212   ? PT Stop Time 1400   ? PT Time Calculation (min) 43 min   ? Activity Tolerance Patient tolerated treatment well   ? Behavior During Therapy [facility] for tasks assessed/performed   ? ?  ?  ? ?  ? ? ?Past Medical History:  ?Diagnosis Date  ? Allergic rhinitis   ? Anxiety   ? Asthma   ? mild  ? Basal cell carcinoma (BCC) 2019  ? nose  ? Canker sore   ? Chronic headaches   ? Colitis   ? Frequent PVCs   ? Hyperlipemia   ? diet only treatment  ? Insomnia   ? Migraines   ? Osteoarthritis   ? PAC (premature atrial contraction)   ? Palpitations   ? PVC's (premature ventricular contractions) 01/17/2017  ? Stress incontinence of urine   ?  Tinnitus   ? TMJ (dislocation of temporomandibular joint)   ? ?Past Surgical History:  ?Procedure Laterality Date  ? BILATERAL SALPINGOOPHORECTOMY  4/08  ? BREAST FIBROADENOMA SURGERY  12/95  ? HAND SURGERY    ? for cyst--Dr. Fredna Dow  ? MOHS SURGERY  2019  ? nose  ? MOLE REMOVAL    ? multiple  ? oral surgeries    ? multiple-as a child  ? RHINOPLASTY  1977  ? TENDON RELEASE  1996  ? ?Patient Active Problem List  ? Diagnosis Date Noted  ? PVC's (premature ventricular contractions) 01/17/2017  ? Palpitations 01/04/2016  ? History of salpingoophorectomy 01/01/2015  ? Asthma, exercise induced 11/09/2012  ? ? ?ONSET DATE: mid-April 2023 ? ?REFERRING DIAG: G43.809 (ICD-10-CM) - Other migraine, not intractable, without status migrainosus R42 (ICD-10-CM) - Vertigo  ? ?THERAPY DIAG:  ?Dizziness and giddiness ? ?Unsteadiness on feet ? ?SUBJECTIVE:  ? ?SUBJECTIVE STATEMENT: ?Pt reports a "true vertigo" since 1992 and notes looking at patterns e.g. wall paper it  shimmers and moves. Notes 5 weeks ago a severe attack where she was reeling and unable to walk requiring physical support to walk. Notes that this episode was approximately 8 hr in duration with resolution of severe symptoms, but has had lingering ill effects. Notes migraines have been present since 1992 and notes some sensitivity to light and sound but notes she is able to use medication to treat these.  Chief complaint at this time is unsteadiness. Notes instances of motion sickness in the past when traveling. Denies any triggering/sensitive positions ?Pt accompanied by: self ? ?PERTINENT HISTORY: migraines, hx of vertigo ? ? ?PAIN:  ?Are you having pain? No ? ?PRECAUTIONS: None ? ?WEIGHT BEARING RESTRICTIONS No ? ?FALLS: Has patient fallen in last 6 months? No ? ?LIVING ENVIRONMENT: ?Lives with: lives with their family and lives with their spouse ?Lives in: House/apartment ?Stairs: Yes: Internal: 12 steps; on right going up and bilateral but cannot reach both and External: 2 steps; none ?Has following equipment at home: None ? ?PLOF: Independent ? ?PATIENT GOALS unable to work on computer and close-up needle work, would like to return to these activities ? ?OBJECTIVE:  ? ?DIAGNOSTIC FINDINGS: extensive diagnostic workup from ENT, GP, OB-GYN. Calorimeter test cold water both ears caused dizziness ? ?COGNITION: ?Overall cognitive status: Within functional limits for tasks assessed ?  ?SENSATION: ?Not tested ? ? ? ?MUSCLE TONE: normal ? ? ? ?POSTURE: No Significant postural limitations ? ? ?Cervical ROM:   ? ?Active A/PROM (deg) ?03/09/2022  ?Flexion WNL  ?Extension WNL  ?Right lateral flexion WNL  ?Left lateral flexion WNL  ?Right rotation WFL  ?Left rotation WFL  ?(Blank rows = not tested) ? ?STRENGTH: WNL ? ?MMT:  ? ?MMT Right ?03/09/2022 Left ?03/09/2022  ?Hip flexion    ?Hip abduction    ?Hip adduction    ?Hip internal rotation    ?Hip external rotation    ?Knee flexion    ?Knee extension    ?Ankle dorsiflexion     ?Ankle plantarflexion    ?Ankle inversion    ?Ankle eversion    ?(Blank rows = not tested) ? ?BED MOBILITY:  ?independent ? ?TRANSFERS: ?independent ? ? ?GAIT: ?Gait pattern:  WNL ?Distance walked: no limitation ? ?FUNCTIONAL TESTs:  ?Dynamic Gait Index: 24/24 ? ?PATIENT SURVEYS:  ?N/A ? ? ?VESTIBULAR ASSESSMENT ? ? GENERAL OBSERVATION: well developed, fit and active lady ?  ? SYMPTOM BEHAVIOR: ?  Subjective history: severe episode 5 weeks ago x  8 hr duration. Severe symtpoms resolved; lingering unsteadiness ?  Non-Vestibular symptoms: tinnitus ?  Type of dizziness: Spinning/Vertigo, Unsteady with head/body turns, and "Funny feeling in the head" ?  Frequency: nearly constant when performing computer work/needle work. Unsteadiness when walking ?  Duration: near constant with aggravating factors ?  Aggravating factors: Worse in the dark, Moving eyes, and computer work, [employer] ?  Relieving factors: no known relieving factors and avoid busy/distracting environments ?  Progression of symptoms: unchanged ? ? OCULOMOTOR EXAM: ?  Ocular Alignment: normal ?  Ocular ROM: No Limitations ?  Spontaneous Nystagmus: absent ?  Gaze-Induced Nystagmus: absent ?  Smooth Pursuits: intact ?  Saccades: intact ?  Convergence/Divergence: 4 cm (decreased medial excursion left eye) ? ?  ? ? VESTIBULAR - OCULAR REFLEX:  ?  Slow VOR: Normal ?  VOR Cancellation: Normal ?  Head-Impulse Test: HIT Right: negative ?HIT Left: negative ?  Dynamic Visual Acuity: Not able to be assessed ?  ? POSITIONAL TESTING: Right Roll Test: none; Duration: none ?Left Roll Test: geotropic nystagmus; Duration: 3-5 beats ?Right Sidelying: none; Duration:   ?Left Sidelying: none; Duration:   ?  ? ?MOTION SENSITIVITY: ? ?  Motion Sensitivity Quotient ? ?Intensity: 0 = none, 1 = Lightheaded, 2 = Mild, 3 = Moderate, 4 = Severe, 5 = Vomiting ? Intensity  ?1. Sitting to supine   ?2. Supine to L side   ?3. Supine to R side   ?4. Supine to sitting   ?5. L Hallpike-Dix    ?6. Up from L    ?7. R Hallpike-Dix   ?8. Up from R    ?9. Sitting, head  ?tipped to L knee   ?10. Head up from L  ?knee   ?11. Sitting, head  ?tipped to R knee   ?12. Head up from R  ?knee   ?13. Sitting he

## 2022-03-10 ENCOUNTER — Ambulatory Visit (INDEPENDENT_AMBULATORY_CARE_PROVIDER_SITE_OTHER): Payer: Medicare PPO

## 2022-03-10 DIAGNOSIS — I471 Supraventricular tachycardia: Secondary | ICD-10-CM | POA: Diagnosis not present

## 2022-03-10 DIAGNOSIS — I493 Ventricular premature depolarization: Secondary | ICD-10-CM | POA: Diagnosis not present

## 2022-03-14 ENCOUNTER — Ambulatory Visit: Payer: Medicare PPO

## 2022-03-14 DIAGNOSIS — R2681 Unsteadiness on feet: Secondary | ICD-10-CM | POA: Diagnosis not present

## 2022-03-14 DIAGNOSIS — R42 Dizziness and giddiness: Secondary | ICD-10-CM

## 2022-03-14 NOTE — Therapy (Signed)
OUTPATIENT PHYSICAL THERAPY TREATMENT NOTE   Patient Name: Margaret [name] MRN: [id number] DOB:1951-01-02, 71 y.o., female Today's Date: 03/14/2022  PCP: Glenis Smoker, MD  REFERRING PROVIDER: Glenis Smoker, MD   END OF SESSION:   PT End of Session - 03/14/22 1451     Visit Number 2    Number of Visits 8    Date for PT Re-Evaluation 06/01/22    Authorization Type [employer] Medicare/Tricare 2023    Authorization Time Period auth required-Cohere    PT Start Time 3614    PT Stop Time 4315    PT Time Calculation (min) 45 min    Activity Tolerance Patient tolerated treatment well    Behavior During Therapy [facility] for tasks assessed/performed             Past Medical History:  Diagnosis Date   Allergic rhinitis    Anxiety    Asthma    mild   Basal cell carcinoma (BCC) 2019   nose   Canker sore    Chronic headaches    Colitis    Frequent PVCs    Hyperlipemia    diet only treatment   Insomnia    Migraines    Osteoarthritis    PAC (premature atrial contraction)    Palpitations    PVC's (premature ventricular contractions) 01/17/2017   Stress incontinence of urine    Tinnitus    TMJ (dislocation of temporomandibular joint)    Past Surgical History:  Procedure Laterality Date   BILATERAL SALPINGOOPHORECTOMY  4/08   BREAST FIBROADENOMA SURGERY  12/95   HAND SURGERY     for cyst--Dr. Fredna Dow   MOHS SURGERY  2019   nose   MOLE REMOVAL     multiple   oral surgeries     multiple-as a child   [place]   Patient Active Problem List   Diagnosis Date Noted   PVC's (premature ventricular contractions) 01/17/2017   Palpitations 01/04/2016   History of salpingoophorectomy 01/01/2015   Asthma, exercise induced 11/09/2012    REFERRING DIAG: G43.809 (ICD-10-CM) - Other migraine, not intractable, without status migrainosus R42 (ICD-10-CM) - Vertigo   THERAPY DIAG:  Dizziness and giddiness  Unsteadiness on feet  Rationale for  Evaluation and Treatment Rehabilitation        ONSET DATE: mid-April 2023   REFERRING DIAG: G43.809 (ICD-10-CM) - Other migraine, not intractable, without status migrainosus R42 (ICD-10-CM) - Vertigo    THERAPY DIAG:  Dizziness and giddiness   Unsteadiness on feet   SUBJECTIVE:    SUBJECTIVE STATEMENT: Pt notes some improvement with symptoms and was able to use computer for a brief period of time. Not attempted needlepoint work.  Pt accompanied by: self   PERTINENT HISTORY: migraines, hx of vertigo     PAIN:  Are you having pain? No   PRECAUTIONS: None   WEIGHT BEARING RESTRICTIONS No   FALLS: Has patient fallen in last 6 months? No   LIVING ENVIRONMENT: Lives with: lives with their family and lives with their spouse Lives in: House/apartment Stairs: Yes: Internal: 12 steps; on right going up and bilateral but cannot reach both and External: 2 steps; none Has following equipment at home: None   PLOF: Independent   PATIENT GOALS unable to work on computer and close-up needle work, would like to return to these activities   OBJECTIVE:    DIAGNOSTIC FINDINGS: extensive diagnostic workup from ENT, GP, OB-GYN. Calorimeter test cold water both  ears caused dizziness   COGNITION: Overall cognitive status: Within functional limits for tasks assessed             SENSATION: Not tested       MUSCLE TONE: normal       POSTURE: No Significant postural limitations     Cervical ROM:     Active A/PROM (deg) 03/09/2022  Flexion WNL  Extension WNL  Right lateral flexion WNL  Left lateral flexion WNL  Right rotation WFL  Left rotation WFL  (Blank rows = not tested)   STRENGTH: WNL   MMT:    MMT Right 03/09/2022 Left 03/09/2022  Hip flexion      Hip abduction      Hip adduction      Hip internal rotation      Hip external rotation      Knee flexion      Knee extension      Ankle dorsiflexion      Ankle plantarflexion      Ankle inversion      Ankle  eversion      (Blank rows = not tested)   BED MOBILITY:  independent   TRANSFERS: independent     GAIT: Gait pattern:  WNL Distance walked: no limitation   FUNCTIONAL TESTs:  Dynamic Gait Index: 24/24   PATIENT SURVEYS:  N/A     VESTIBULAR ASSESSMENT              GENERAL OBSERVATION: well developed, fit and active lady                        SYMPTOM BEHAVIOR:                       Subjective history: severe episode 5 weeks ago x 8 hr duration. Severe symtpoms resolved; lingering unsteadiness                       Non-Vestibular symptoms: tinnitus                       Type of dizziness: Spinning/Vertigo, Unsteady with head/body turns, and "Funny feeling in the head"                       Frequency: nearly constant when performing computer work/needle work. Unsteadiness when walking                       Duration: near constant with aggravating factors                       Aggravating factors: Worse in the dark, Moving eyes, and computer work, [employer]                       Relieving factors: no known relieving factors and avoid busy/distracting environments                       Progression of symptoms: unchanged              OCULOMOTOR EXAM:                       Ocular Alignment: normal                       Ocular  ROM: No Limitations                       Spontaneous Nystagmus: absent                       Gaze-Induced Nystagmus: absent                       Smooth Pursuits: intact                       Saccades: intact                       Convergence/Divergence: 4 cm (decreased medial excursion left eye)                            VESTIBULAR - OCULAR REFLEX:                        Slow VOR: Normal                       VOR Cancellation: Normal                       Head-Impulse Test: HIT Right: negative HIT Left: negative                       Dynamic Visual Acuity: Not able to be assessed                        POSITIONAL TESTING: Right Roll Test: none;  Duration: none Left Roll Test: geotropic nystagmus; Duration: 3-5 beats Right Sidelying: none; Duration:   Left Sidelying: none; Duration:                 MOTION SENSITIVITY:                         Motion Sensitivity Quotient   Intensity: 0 = none, 1 = Lightheaded, 2 = Mild, 3 = Moderate, 4 = Severe, 5 = Vomiting   Intensity  1. Sitting to supine    2. Supine to L side    3. Supine to R side    4. Supine to sitting    5. L Hallpike-Dix    6. Up from L     7. R Hallpike-Dix    8. Up from R     9. Sitting, head  tipped to L knee    10. Head up from L  knee    11. Sitting, head  tipped to R knee    12. Head up from R  knee    13. Sitting head turns x5    14.Sitting head nods x5    15. In stance, 180  turn to L     16. In stance, 180  turn to R                Patient notes increased symptoms/unsteadiness following session with rolling for habituation OTHOSTATICS: not done   FUNCTIONAL GAIT: Dynamic Gait Index: 24/24        M-CTSIB  Condition 1: Firm Surface, EO 30 Sec, Normal Sway  Condition 2: Firm Surface, EC 30 Sec, Normal Sway  Condition 3: Foam Surface, EO 30  Sec, Mild Sway  Condition 4: Foam Surface, EC 30 Sec, Moderate Sway        VESTIBULAR TREATMENT:   Canalith Repositioning:                       Comment: not indicated Gaze Adaptation:                       To be initiated Habituation:                       Repeated Rolling: number of reps: 5 Other:   TODAY'S TREATMENT: 03/14/22 Activity Comments  Rolling left/right habituation x 5  1/10 dizziness/woozy  Brandt-Daroff x 5  Increased symptoms arising from left  VOR x 1 horizontal/vertical 2x60 sec Corrective saccades right > left               PATIENT EDUCATION: Education details: ccess Code [email or web address], Access Code [email or web address]. Discussion/demonstration of relevant neuroanatomy Person educated: Patient Education method: Explanation, Demonstration, and Handouts Education comprehension:  verbalized understanding     GOALS: Goals reviewed with patient? Yes   SHORT TERM GOALS: Target date: 04/06/2022     Patient to be independent with HEP for gaze adaptation, habituation, balance, and general mobility Baseline: Goal status: INITIAL   2.  Manifest improved balance/body position as evidenced by normal body sway when standing on compliant surface/eyes closed Baseline: moderate sway Goal status: INITIAL       LONG TERM GOALS: Target date: 06/01/2022   Patient will report resolution of symptoms as evidenced by ability to perform computer work and [employer] without provocation of unsteadiness Baseline: unable Goal status: INITIAL     ASSESSMENT:   CLINICAL IMPRESSION: Pt notes symptoms when rolling side to side left > right and when performing Brandt-Daroff greater symptom provocation when arising from left sidelying to sitting. Gaze stabilization VOR x 1 reveals retinal slip/corrective saccades right eye > left eye with rotation and pt reports fatigue and symptoms with 30-60 sec practice of movement. Discussed symptom provocation and strategy/rationale for scaling these activities for HEP. Continued sessions to progress vestibular rehabilitation activities and resolve symptoms.     OBJECTIVE IMPAIRMENTS decreased activity tolerance, decreased balance, and dizziness.    ACTIVITY LIMITATIONS occupation and leisure activities .    PERSONAL FACTORS Time since onset of injury/illness/exacerbation and 1 comorbidity: hx of migraine  are also affecting patient's functional outcome.      REHAB POTENTIAL: Good   CLINICAL DECISION MAKING: Evolving/moderate complexity   EVALUATION COMPLEXITY: Moderate     PLAN: PT FREQUENCY: 1-2x/week   PT DURATION: 12 weeks   PLANNED INTERVENTIONS: Therapeutic exercises, Therapeutic activity, Neuromuscular re-education, Balance training, Gait training, Patient/Family education, Joint mobilization, Stair training, Vestibular training, and  Manual therapy   PLAN FOR NEXT SESSION: HEP review of rolling habituation, gaze stabilization VOR x 1 review. Progress to standing/foam   3:52 PM, 03/14/22 M. Sherlyn Lees, PT, DPT Physical Therapist- [place] Office Number: [phone]

## 2022-03-22 ENCOUNTER — Ambulatory Visit: Payer: Medicare PPO

## 2022-03-22 DIAGNOSIS — R2681 Unsteadiness on feet: Secondary | ICD-10-CM | POA: Diagnosis not present

## 2022-03-22 DIAGNOSIS — R42 Dizziness and giddiness: Secondary | ICD-10-CM

## 2022-03-22 NOTE — Therapy (Signed)
OUTPATIENT PHYSICAL THERAPY TREATMENT NOTE   Patient Name: Margaret [name] MRN: [id number] DOB:03/06/1951, 71 y.o., female Today's Date: 03/22/2022  PCP: Glenis Smoker, MD  REFERRING PROVIDER: Glenis Smoker, MD   END OF SESSION:   PT End of Session - 03/22/22 1313     Visit Number 3    Number of Visits 8    Date for PT Re-Evaluation 06/01/22    Authorization Type [employer] Medicare/Tricare 2023    Authorization Time Period auth required-Cohere    PT Start Time 1315    PT Stop Time 1400    PT Time Calculation (min) 45 min    Activity Tolerance Patient tolerated treatment well    Behavior During Therapy [facility] for tasks assessed/performed             Past Medical History:  Diagnosis Date   Allergic rhinitis    Anxiety    Asthma    mild   Basal cell carcinoma (BCC) 2019   nose   Canker sore    Chronic headaches    Colitis    Frequent PVCs    Hyperlipemia    diet only treatment   Insomnia    Migraines    Osteoarthritis    PAC (premature atrial contraction)    Palpitations    PVC's (premature ventricular contractions) 01/17/2017   Stress incontinence of urine    Tinnitus    TMJ (dislocation of temporomandibular joint)    Past Surgical History:  Procedure Laterality Date   BILATERAL SALPINGOOPHORECTOMY  4/08   BREAST FIBROADENOMA SURGERY  12/95   HAND SURGERY     for cyst--Dr. Fredna Dow   MOHS SURGERY  2019   nose   MOLE REMOVAL     multiple   oral surgeries     multiple-as a child   [place]   Patient Active Problem List   Diagnosis Date Noted   PVC's (premature ventricular contractions) 01/17/2017   Palpitations 01/04/2016   History of salpingoophorectomy 01/01/2015   Asthma, exercise induced 11/09/2012    REFERRING DIAG: G43.809 (ICD-10-CM) - Other migraine, not intractable, without status migrainosus R42 (ICD-10-CM) - Vertigo   THERAPY DIAG:  Dizziness and giddiness  Unsteadiness on feet  Rationale for  Evaluation and Treatment Rehabilitation        ONSET DATE: mid-April 2023   REFERRING DIAG: G43.809 (ICD-10-CM) - Other migraine, not intractable, without status migrainosus R42 (ICD-10-CM) - Vertigo    THERAPY DIAG:  Dizziness and giddiness   Unsteadiness on feet   SUBJECTIVE:    SUBJECTIVE STATEMENT: Pt notes provocation of symptoms when performing Brandt-Daroff arising from the left side, grades a 2 intensity relative to motion sensitivity.  Pt accompanied by: self   PERTINENT HISTORY: migraines, hx of vertigo     PAIN:  Are you having pain? No   PRECAUTIONS: None   WEIGHT BEARING RESTRICTIONS No   FALLS: Has patient fallen in last 6 months? No   LIVING ENVIRONMENT: Lives with: lives with their family and lives with their spouse Lives in: House/apartment Stairs: Yes: Internal: 12 steps; on right going up and bilateral but cannot reach both and External: 2 steps; none Has following equipment at home: None   PLOF: Independent   PATIENT GOALS unable to work on computer and close-up needle work, would like to return to these activities   OBJECTIVE:    DIAGNOSTIC FINDINGS: extensive diagnostic workup from ENT, GP, OB-GYN. Calorimeter test cold water both ears  caused dizziness   COGNITION: Overall cognitive status: Within functional limits for tasks assessed             SENSATION: Not tested       MUSCLE TONE: normal       POSTURE: No Significant postural limitations     Cervical ROM:     Active A/PROM (deg) 03/09/2022  Flexion WNL  Extension WNL  Right lateral flexion WNL  Left lateral flexion WNL  Right rotation WFL  Left rotation WFL  (Blank rows = not tested)   STRENGTH: WNL   MMT:    MMT Right 03/09/2022 Left 03/09/2022  Hip flexion      Hip abduction      Hip adduction      Hip internal rotation      Hip external rotation      Knee flexion      Knee extension      Ankle dorsiflexion      Ankle plantarflexion      Ankle inversion       Ankle eversion      (Blank rows = not tested)   BED MOBILITY:  independent   TRANSFERS: independent     GAIT: Gait pattern:  WNL Distance walked: no limitation   FUNCTIONAL TESTs:  Dynamic Gait Index: 24/24   PATIENT SURVEYS:  N/A     VESTIBULAR ASSESSMENT              GENERAL OBSERVATION: well developed, fit and active lady                        SYMPTOM BEHAVIOR:                       Subjective history: severe episode 5 weeks ago x 8 hr duration. Severe symtpoms resolved; lingering unsteadiness                       Non-Vestibular symptoms: tinnitus                       Type of dizziness: Spinning/Vertigo, Unsteady with head/body turns, and "Funny feeling in the head"                       Frequency: nearly constant when performing computer work/needle work. Unsteadiness when walking                       Duration: near constant with aggravating factors                       Aggravating factors: Worse in the dark, Moving eyes, and computer work, [employer]                       Relieving factors: no known relieving factors and avoid busy/distracting environments                       Progression of symptoms: unchanged              OCULOMOTOR EXAM:                       Ocular Alignment: normal                       Ocular ROM:  No Limitations                       Spontaneous Nystagmus: absent                       Gaze-Induced Nystagmus: absent                       Smooth Pursuits: intact                       Saccades: intact                       Convergence/Divergence: 4 cm (decreased medial excursion left eye)                            VESTIBULAR - OCULAR REFLEX:                        Slow VOR: Normal                       VOR Cancellation: Normal                       Head-Impulse Test: HIT Right: negative HIT Left: negative                       Dynamic Visual Acuity: Not able to be assessed                        POSITIONAL TESTING: Right Roll  Test: none; Duration: none Left Roll Test: geotropic nystagmus; Duration: 3-5 beats Right Sidelying: none; Duration:   Left Sidelying: none; Duration:                 MOTION SENSITIVITY:                         Motion Sensitivity Quotient   Intensity: 0 = none, 1 = Lightheaded, 2 = Mild, 3 = Moderate, 4 = Severe, 5 = Vomiting   Intensity  1. Sitting to supine    2. Supine to L side    3. Supine to R side    4. Supine to sitting    5. L Hallpike-Dix    6. Up from L     7. R Hallpike-Dix    8. Up from R     9. Sitting, head  tipped to L knee    10. Head up from L  knee    11. Sitting, head  tipped to R knee    12. Head up from R  knee    13. Sitting head turns x5    14.Sitting head nods x5    15. In stance, 180  turn to L     16. In stance, 180  turn to R                 OTHOSTATICS: not done   FUNCTIONAL GAIT: Dynamic Gait Index: 24/24        M-CTSIB  Condition 1: Firm Surface, EO 30 Sec, Normal Sway  Condition 2: Firm Surface, EC 30 Sec, Normal Sway  Condition 3: Foam Surface, EO 30 Sec, Mild Sway  Condition 4: Foam Surface, EC 30  Sec, Moderate Sway        VESTIBULAR TREATMENT:   Canalith Repositioning:                       Comment: not indicated Gaze Adaptation:                       To be initiated Habituation:                       Repeated Rolling: number of reps: 5 Other:   TODAY'S TREATMENT: 03/14/22 Activity Comments  Rolling: log roll left/right No symptoms, D/C this  Brandt-Daroff x 1 Increased symptoms arising from left  VOR x 1 horizontal/vertical (seated) x 30 sec 120 bpm. Reports 1-2 out of 5 for motion sensitivity  Standing on airex pad: 30 sec eyes closed, 5x head rotation/vertical, eyes open/closed   Brock string: 3 beads, 10 sec trials 3 rounds. 5 sec 3 rounds Beads placed approx 12 inch         PATIENT EDUCATION: Education details: ccess Code [email or web address], Access Code [email or web address]. Discussion/demonstration of relevant  neuroanatomy Person educated: Patient Education method: Explanation, Demonstration, and Handouts Education comprehension: verbalized understanding     GOALS: Goals reviewed with patient? Yes   SHORT TERM GOALS: Target date: 04/06/2022     Patient to be independent with HEP for gaze adaptation, habituation, balance, and general mobility Baseline: Goal status: INITIAL   2.  Manifest improved balance/body position as evidenced by normal body sway when standing on compliant surface/eyes closed Baseline: moderate sway Goal status: INITIAL       LONG TERM GOALS: Target date: 06/01/2022   Patient will report resolution of symptoms as evidenced by ability to perform computer work and [employer] without provocation of unsteadiness Baseline: unable Goal status: INITIAL     ASSESSMENT:   CLINICAL IMPRESSION: Pt notes some subjective improvement in motion sensitivity but notes continued issue with gaze stability during VOR noting that stationary target "wavers" especially with increased frquency/bpm. Rolling side-side discontinued as this does not cause any issue at present. Brandt-Daroff stimulating to symptoms when arising from left side but notes some decrease in intensity.  Addition of Brock string convergence activity to hopefully improve her tolerance to her fine motor activities that require close work. Continued sessions to progress techniques and habituation     OBJECTIVE IMPAIRMENTS decreased activity tolerance, decreased balance, and dizziness.    ACTIVITY LIMITATIONS occupation and leisure activities .    PERSONAL FACTORS Time since onset of injury/illness/exacerbation and 1 comorbidity: hx of migraine  are also affecting patient's functional outcome.      REHAB POTENTIAL: Good   CLINICAL DECISION MAKING: Evolving/moderate complexity   EVALUATION COMPLEXITY: Moderate     PLAN: PT FREQUENCY: 1-2x/week   PT DURATION: 12 weeks   PLANNED INTERVENTIONS: Therapeutic  exercises, Therapeutic activity, Neuromuscular re-education, Balance training, Gait training, Patient/Family education, Joint mobilization, Stair training, Vestibular training, and Manual therapy   PLAN FOR NEXT SESSION: HEP review of rolling habituation, gaze stabilization VOR x 1 review. Progress to standing/foam   1:14 PM, 03/22/22 M. Sherlyn Lees, PT, DPT Physical Therapist- [place] Office Number: [phone]

## 2022-03-28 ENCOUNTER — Ambulatory Visit: Payer: Medicare PPO | Attending: Family Medicine

## 2022-03-28 DIAGNOSIS — R2681 Unsteadiness on feet: Secondary | ICD-10-CM | POA: Diagnosis not present

## 2022-03-28 DIAGNOSIS — R42 Dizziness and giddiness: Secondary | ICD-10-CM | POA: Insufficient documentation

## 2022-03-28 NOTE — Therapy (Addendum)
OUTPATIENT PHYSICAL THERAPY TREATMENT NOTE   Patient Name: Margaret [name] MRN: [id number] DOB:[birth year], 71 y.o., female Today's Date: 03/28/2022  PCP: Glenis Smoker, MD  REFERRING PROVIDER: Glenis Smoker, MD   END OF SESSION:   PT End of Session - 03/28/22 1359     Visit Number 4    Number of Visits 8    Date for PT Re-Evaluation 06/01/22    Authorization Type [employer] Medicare/Tricare 2023    Authorization Time Period auth required-[employer]here    PT Start Time 1400    PT Stop Time 6301    PT Time Calculation (min) 45 min    Activity Tolerance Patient tolerated treatment well    Behavior During Therapy [facility] for tasks assessed/performed             Past Medical History:  Diagnosis Date   Allergic rhinitis    Anxiety    Asthma    mild   Basal cell carcinoma (BCC) 2019   nose   Canker sore    Chronic headaches    Colitis    Frequent PVCs    Hyperlipemia    diet only treatment   Insomnia    Migraines    Osteoarthritis    PAC (premature atrial contraction)    Palpitations    PVC's (premature ventricular contractions) 01/17/2017   Stress incontinence of urine    Tinnitus    TMJ (dislocation of temporomandibular joint)    Past Surgical History:  Procedure Laterality Date   BILATERAL SALPINGOOPHORECTOMY  4/08   BREAST FIBROADENOMA SURGERY  12/95   HAND SURGERY     for cyst--Dr. Fredna Dow   MOHS SURGERY  2019   nose   MOLE REMOVAL     multiple   oral surgeries     multiple-as a child   [place]   Patient Active Problem List   Diagnosis Date Noted   PVC's (premature ventricular contractions) 01/17/2017   Palpitations 01/04/2016   History of salpingoophorectomy 01/01/2015   Asthma, exercise induced 11/09/2012    REFERRING DIAG: G43.809 (ICD-10-CM) - Other migraine, not intractable, without status migrainosus R42 (ICD-10-CM) - Vertigo   THERAPY DIAG:  Dizziness and giddiness  Unsteadiness on feet  Rationale for  Evaluation and Treatment Rehabilitation        ONSET DATE: mid-April 2023   REFERRING DIAG: G43.809 (ICD-10-CM) - Other migraine, not intractable, without status migrainosus R42 (ICD-10-CM) - Vertigo    THERAPY DIAG:  Dizziness and giddiness   Unsteadiness on feet   SUBJECTIVE:    SUBJECTIVE STATEMENT: Less severe symptoms noted and able to play computer for an hour and perform needlepoint without much side effects. Still feeling some from the Brandt-Daroff positions. Made a Brock string to practice convergence Pt accompanied by: self   PERTINENT HISTORY: migraines, hx of vertigo     PAIN:  Are you having pain? No   PRECAUTIONS: None   WEIGHT BEARING RESTRICTIONS No   FALLS: Has patient fallen in last 6 months? No   LIVING ENVIRONMENT: Lives with: lives with their family and lives with their spouse Lives in: House/apartment Stairs: Yes: Internal: 12 steps; on right going up and bilateral but cannot reach both and External: 2 steps; none Has following equipment at home: None   PLOF: Independent   PATIENT GOALS unable to work on computer and close-up needle work, would like to return to these activities   OBJECTIVE:   VESTIBULAR TREATMENT:  Other:   TODAY'S TREATMENT: 03/28/22 Activity Comments  VOR x 2 horizontal/vertical 3x30 sec   Walking VOR x1 horizontal/vertical Stationary object for target  Brandt-Daroff x 5 reps Increased symptoms arising from left  VOR cancellation 1x30 sec Notes some issue with tracking  Standing on airex pad: 2x30 sec eyes closed, head rotation/vertical, eyes closed Unsteadiness with eyes closed mild-moderate sway, increased LOB with head movements             DIAGNOSTIC FINDINGS: extensive diagnostic workup from ENT, GP, OB-GYN. Calorimeter test cold water both ears caused dizziness   COGNITION: Overall cognitive status: Within functional limits for tasks assessed             SENSATION: Not tested       MUSCLE TONE:  normal       POSTURE: No Significant postural limitations     Cervical ROM:     Active A/PROM (deg) 03/09/2022  Flexion WNL  Extension WNL  Right lateral flexion WNL  Left lateral flexion WNL  Right rotation WFL  Left rotation WFL  (Blank rows = not tested)   STRENGTH: WNL   MMT:    MMT Right 03/09/2022 Left 03/09/2022  Hip flexion      Hip abduction      Hip adduction      Hip internal rotation      Hip external rotation      Knee flexion      Knee extension      Ankle dorsiflexion      Ankle plantarflexion      Ankle inversion      Ankle eversion      (Blank rows = not tested)   BED MOBILITY:  independent   TRANSFERS: independent     GAIT: Gait pattern:  WNL Distance walked: no limitation   FUNCTIONAL TESTs:  Dynamic Gait Index: 24/24   PATIENT SURVEYS:  N/A     VESTIBULAR ASSESSMENT              GENERAL OBSERVATION: well developed, fit and active lady                        SYMPTOM BEHAVIOR:                       Subjective history: severe episode 5 weeks ago x 8 hr duration. Severe symtpoms resolved; lingering unsteadiness                       Non-Vestibular symptoms: tinnitus                       Type of dizziness: Spinning/Vertigo, Unsteady with head/body turns, and "Funny feeling in the head"                       Frequency: nearly constant when performing computer work/needle work. Unsteadiness when walking                       Duration: near constant with aggravating factors                       Aggravating factors: Worse in the dark, Moving eyes, and computer work, [employer]                       Relieving factors: no known  relieving factors and avoid busy/distracting environments                       Progression of symptoms: unchanged              OCULOMOTOR EXAM:                       Ocular Alignment: normal                       Ocular ROM: No Limitations                       Spontaneous Nystagmus: absent                        Gaze-Induced Nystagmus: absent                       Smooth Pursuits: intact                       Saccades: intact                       Convergence/Divergence: 4 cm (decreased medial excursion left eye)                            VESTIBULAR - OCULAR REFLEX:                        Slow VOR: Normal                       VOR Cancellation: Normal                       Head-Impulse Test: HIT Right: negative HIT Left: negative                       Dynamic Visual Acuity: Not able to be assessed                        POSITIONAL TESTING: Right Roll Test: none; Duration: none Left Roll Test: geotropic nystagmus; Duration: 3-5 beats Right Sidelying: none; Duration:   Left Sidelying: none; Duration:                 MOTION SENSITIVITY:                         Motion Sensitivity Quotient   Intensity: 0 = none, 1 = Lightheaded, 2 = Mild, 3 = Moderate, 4 = Severe, 5 = Vomiting   Intensity  1. Sitting to supine    2. Supine to L side    3. Supine to R side    4. Supine to sitting    5. L Hallpike-Dix    6. Up from L     7. R Hallpike-Dix    8. Up from R     9. Sitting, head  tipped to L knee    10. Head up from L  knee    11. Sitting, head  tipped to R knee    12. Head up from R  knee    13. Sitting head turns x5    14.Sitting head nods  x5    15. In stance, 180  turn to L     16. In stance, 180  turn to R                 OTHOSTATICS: not done   FUNCTIONAL GAIT: Dynamic Gait Index: 24/24        M-CTSIB  Condition 1: Firm Surface, EO 30 Sec, Normal Sway  Condition 2: Firm Surface, EC 30 Sec, Normal Sway  Condition 3: Foam Surface, EO 30 Sec, Mild Sway  Condition 4: Foam Surface, EC 30 Sec, Moderate Sway          PATIENT EDUCATION: Education details: ccess Code [email or web address], Access Code [email or web address]. Discussion/demonstration of relevant neuroanatomy Person educated: Patient Education method: Explanation, Demonstration, and Handouts Education comprehension: verbalized  understanding     GOALS: Goals reviewed with patient? Yes   SHORT TERM GOALS: Target date: 04/06/2022     Patient to be independent with HEP for gaze adaptation, habituation, balance, and general mobility Baseline: Goal status: MET   2.  Manifest improved balance/body position as evidenced by normal body sway when standing on compliant surface/eyes closed Baseline: moderate sway Goal status: On-going       LONG TERM GOALS: Target date: 06/01/2022   Patient will report resolution of symptoms as evidenced by ability to perform computer work and [employer] without provocation of unsteadiness Baseline: unable Goal status: On-going     ASSESSMENT:   CLINICAL IMPRESSION: Notes some improvement in activity tolerance as evidenced by ability to play computer game and perform some needlepoint activities where she had not been able to prior to start of care. Reports some symptoms with Brandt-Daroff as symptoms are less intense with arising from the left. Progressed to VOR x 2 in today's session in sitting and VOR x 1 with walking to progress POC details. Demonstrates good mastery of HEP activities to date. Continued imbalance with compliant surface/eyes closed, especially with head turns/eyes closed causing LOB. Continued sessions to progress vestibular rehab/habituation/gaze stabilization     OBJECTIVE IMPAIRMENTS decreased activity tolerance, decreased balance, and dizziness.    ACTIVITY LIMITATIONS occupation and leisure activities .    PERSONAL FACTORS Time since onset of injury/illness/exacerbation and 1 comorbidity: hx of migraine  are also affecting patient's functional outcome.      REHAB POTENTIAL: Good   CLINICAL DECISION MAKING: Evolving/moderate complexity   EVALUATION COMPLEXITY: Moderate     PLAN: PT FREQUENCY: 1-2x/week   PT DURATION: 12 weeks   PLANNED INTERVENTIONS: Therapeutic exercises, Therapeutic activity, Neuromuscular re-education, Balance training, Gait  training, Patient/Family education, Joint mobilization, Stair training, Vestibular training, and Manual therapy   PLAN FOR NEXT SESSION: review VOR x 2, balance with eyes closed/head turns, additional habituation activities?   2:00 PM, 03/28/22 M. Sherlyn Lees, PT, DPT Physical Therapist- [place] Office Number: [phone]

## 2022-03-30 ENCOUNTER — Other Ambulatory Visit: Payer: Self-pay | Admitting: *Deleted

## 2022-03-30 DIAGNOSIS — I493 Ventricular premature depolarization: Secondary | ICD-10-CM

## 2022-03-31 ENCOUNTER — Other Ambulatory Visit: Payer: TRICARE For Life (TFL)

## 2022-03-31 DIAGNOSIS — R002 Palpitations: Secondary | ICD-10-CM | POA: Diagnosis not present

## 2022-03-31 DIAGNOSIS — I493 Ventricular premature depolarization: Secondary | ICD-10-CM | POA: Diagnosis not present

## 2022-04-01 LAB — BASIC METABOLIC PANEL
BUN/Creatinine Ratio: 21 (ref 12–28)
BUN: 19 mg/dL (ref 8–27)
CO2: 27 mmol/L (ref 20–29)
Calcium: 9.2 mg/dL (ref 8.7–10.3)
Chloride: 101 mmol/L (ref 96–106)
Creatinine, Ser: 0.92 mg/dL (ref 0.57–1.00)
Glucose: 79 mg/dL (ref 70–99)
Potassium: 4.4 mmol/L (ref 3.5–5.2)
Sodium: 140 mmol/L (ref 134–144)
eGFR: 67 mL/min/{1.73_m2} (ref >59)

## 2022-04-01 LAB — MAGNESIUM: Magnesium: 2.2 mg/dL (ref 1.6–2.3)

## 2022-04-01 LAB — TSH: TSH: 1.59 u[IU]/mL (ref 0.450–4.500)

## 2022-04-01 NOTE — Therapy (Addendum)
OUTPATIENT PHYSICAL THERAPY TREATMENT NOTE   Patient Name: Margaret [name] MRN: [id number] DOB:[birth year], 71 y.o., female Today's Date: 04/04/2022  PCP: Glenis Smoker, MD  REFERRING PROVIDER: Glenis Smoker, MD    Progress Note Reporting Period 03/09/22 to 04/04/22  See note below for Objective Data and Assessment of Progress/Goals.      END OF SESSION:   PT End of Session - 04/04/22 1550     Visit Number 5    Number of Visits 8    Date for PT Re-Evaluation 06/01/22    Authorization Type [employer]/Tricare 2023    Authorization Time Period auth required-Cohere    PT Start Time 1318    PT Stop Time 1402    PT Time Calculation (min) 44 min    Equipment Utilized During Treatment Gait belt    Activity Tolerance Patient tolerated treatment well    Behavior During Therapy WFL for tasks assessed/performed              Past Medical History:  Diagnosis Date   Allergic rhinitis    Anxiety    Asthma    mild   Basal cell carcinoma (BCC) 2019   nose   Canker sore    Chronic headaches    Colitis    Frequent PVCs    Hyperlipemia    diet only treatment   Insomnia    Migraines    Osteoarthritis    PAC (premature atrial contraction)    Palpitations    PVC's (premature ventricular contractions) 01/17/2017   Stress incontinence of urine    Tinnitus    TMJ (dislocation of temporomandibular joint)    Past Surgical History:  Procedure Laterality Date   BILATERAL SALPINGOOPHORECTOMY  4/08   BREAST FIBROADENOMA SURGERY  12/95   HAND SURGERY     for cyst--Dr. Fredna Dow   MOHS SURGERY  2019   nose   MOLE REMOVAL     multiple   oral surgeries     multiple-as a child   [place]   Patient Active Problem List   Diagnosis Date Noted   PVC's (premature ventricular contractions) 01/17/2017   Palpitations 01/04/2016   History of salpingoophorectomy 01/01/2015   Asthma, exercise induced 11/09/2012    REFERRING DIAG:  G43.809 (ICD-10-CM) - Other migraine, not intractable, without status migrainosus R42 (ICD-10-CM) - Vertigo   THERAPY DIAG:  Dizziness and giddiness  Unsteadiness on feet  Rationale for Evaluation and Treatment Rehabilitation        ONSET DATE: mid-April 2023   REFERRING DIAG: G43.809 (ICD-10-CM) - Other migraine, not intractable, without status migrainosus R42 (ICD-10-CM) - Vertigo    THERAPY DIAG:  Dizziness and giddiness   Unsteadiness on feet   SUBJECTIVE:    SUBJECTIVE STATEMENT: Feels like dizziness is better but feels like they are bringing on more HAs.  Pt accompanied by: self   PERTINENT HISTORY: migraines, hx of vertigo     PAIN:  Are you having pain? 0.5/10 c/o mild discomfort in the back of the head   PRECAUTIONS: None   WEIGHT BEARING RESTRICTIONS No   FALLS: Has patient fallen in last 6 months? 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, would like to return to these activities   OBJECTIVE:     TODAY'S TREATMENT: 04/04/22 Activity Comments  Standing VOR x 2 vertical and horizontal 30"; 30" in front of blinds C/o mild head pressure vertical; c/o mild dizziness horizontal; c/o slightly worse symptoms but tolerable in front of blinds  gait + tossing ball vertically and horizontally 4x 51f C/o 2/10 dizziness  STS + vertical VOR cancellation 5x, 5x on foam 1/10 dizziness  brandt daroff EO 1x, EC 1x C/o mild dizziness upon sitting up  romberg EC on foam 2x30" Moderate sway  wall bumps (shoulder/hip) EO,EC, EC on foam C/o 3/10 dizziness; cues for sequencing   Romberg EC head turns/nods 30" Good stability   PATIENT EDUCATION: Education  details: update to HEP; edu on 30 day hold Person educated: Patient Education method: EConsulting civil engineer Demonstration, TCorporate treasurercues, Verbal cues, and Handouts Education comprehension: verbalized understanding and returned demonstration  Access Code: [email or web address] Date: 04/04/2022 Prepared by: [facility]  Exercises - Seated Gaze Stabilization with Head Rotation  - 1 x daily - 7 x weekly - 2-3 sets - 30 sec hold - Seated Gaze Stabilization with Head Nod  - 1 x daily - 7 x weekly - 2-3 sets - 30 sec hold - Standing VOR Cancellation  - 1 x daily - 5 x weekly - 2-3 sets - 30 sec hold - Romberg Stance with Head Rotation  - 1 x daily - 5 x weekly - 2 sets - 30 sec hold - Romberg Stance with Head Nods  - 1 x daily - 5 x weekly - 2 sets - 30 sec hold - Gaze stability (VOR) x2 with Static March with horizontal head turns  - 1 x daily - 5 x weekly - 2-3 sets - 30 sec hold - Gaze Stability (VOR) x2 with Static March With Vertical Head Turns  - 1 x daily - 5 x weekly - 2-3 sets - 30 sec hold   Below measures were taken at time of initial evaluation unless otherwise specified:    DIAGNOSTIC FINDINGS: extensive diagnostic workup from ENT, GP, OB-GYN. Calorimeter test cold water both ears caused dizziness   COGNITION: Overall cognitive status: Within functional limits for tasks assessed             SENSATION: Not tested       MUSCLE TONE: normal       POSTURE: No Significant postural limitations     Cervical ROM:     Active A/PROM (deg) 03/09/2022  Flexion WNL  Extension WNL  Right lateral flexion WNL  Left lateral flexion WNL  Right rotation WFL  Left rotation WFL  (Blank rows = not tested)   STRENGTH: WNL   MMT:    MMT Right 03/09/2022 Left 03/09/2022  Hip flexion      Hip abduction      Hip adduction      Hip internal rotation      Hip external rotation      Knee flexion      Knee extension       Ankle dorsiflexion      Ankle plantarflexion      Ankle inversion      Ankle eversion      (Blank rows = not tested)   BED MOBILITY:  independent   TRANSFERS: independent     GAIT: Gait pattern:  WNL Distance walked: no limitation  FUNCTIONAL TESTs:  Dynamic Gait Index: 24/24   PATIENT SURVEYS:  N/A     VESTIBULAR ASSESSMENT              GENERAL OBSERVATION: well developed, fit and active lady                        SYMPTOM BEHAVIOR:                       Subjective history: severe episode 5 weeks ago x 8 hr duration. Severe symtpoms resolved; lingering unsteadiness                       Non-Vestibular symptoms: tinnitus                       Type of dizziness: Spinning/Vertigo, Unsteady with head/body turns, and "Funny feeling in the head"                       Frequency: nearly constant when performing computer work/needle work. Unsteadiness when walking                       Duration: near constant with aggravating factors                       Aggravating factors: Worse in the dark, Moving eyes, and computer work, [employer]                       Relieving factors: no known relieving factors and avoid busy/distracting environments                       Progression of symptoms: unchanged              OCULOMOTOR EXAM:                       Ocular Alignment: normal                       Ocular ROM: No Limitations                       Spontaneous Nystagmus: absent                       Gaze-Induced Nystagmus: absent                       Smooth Pursuits: intact                       Saccades: intact                       Convergence/Divergence: 4 cm (decreased medial excursion left eye)                            VESTIBULAR - OCULAR REFLEX:                        Slow VOR: Normal                       VOR Cancellation: Normal  Head-Impulse Test: HIT Right: negative HIT Left: negative                       Dynamic Visual Acuity: Not able  to be assessed                        POSITIONAL TESTING: Right Roll Test: none; Duration: none Left Roll Test: geotropic nystagmus; Duration: 3-5 beats Right Sidelying: none; Duration:   Left Sidelying: none; Duration:                 MOTION SENSITIVITY:                         Motion Sensitivity Quotient   Intensity: 0 = none, 1 = Lightheaded, 2 = Mild, 3 = Moderate, 4 = Severe, 5 = Vomiting   Intensity  1. Sitting to supine    2. Supine to L side    3. Supine to R side    4. Supine to sitting    5. L Hallpike-Dix    6. Up from L     7. R Hallpike-Dix    8. Up from R     9. Sitting, head  tipped to L knee    10. Head up from L  knee    11. Sitting, head  tipped to R knee    12. Head up from R  knee    13. Sitting head turns x5    14.Sitting head nods x5    15. In stance, 180  turn to L     16. In stance, 180  turn to R                 OTHOSTATICS: not done   FUNCTIONAL GAIT: Dynamic Gait Index: 24/24        M-CTSIB  Condition 1: Firm Surface, EO 30 Sec, Normal Sway  Condition 2: Firm Surface, EC 30 Sec, Normal Sway  Condition 3: Foam Surface, EO 30 Sec, Mild Sway  Condition 4: Foam Surface, EC 30 Sec, Moderate Sway          PATIENT EDUCATION: Education details: ccess Code [email or web address], Access Code [email or web address]. Discussion/demonstration of relevant neuroanatomy Person educated: Patient Education method: Explanation, Demonstration, and Handouts Education comprehension: verbalized understanding     GOALS: Goals reviewed with patient? Yes   SHORT TERM GOALS: Target date: 04/06/2022     Patient to be independent with HEP for gaze adaptation, habituation, balance, and general mobility Baseline: Goal status: MET   2.  Manifest improved balance/body position as evidenced by normal body sway when standing on compliant surface/eyes closed Baseline: moderate sway Goal status: moderate sway; NOT MET 04/04/22       LONG TERM GOALS: Target date: 06/01/2022    Patient will report resolution of symptoms as evidenced by ability to perform computer work and [employer] without provocation of unsteadiness Baseline: unable Goal status: PARTIALLY MET 04/04/22; able to tolerate a little bit of needlework without symptoms; tolerates 1 hour of computer work with some head "pulling" but no dizziness      ASSESSMENT:   CLINICAL IMPRESSION: Patient arrived to session with report of improvement in dizziness however notes HAs from HEP. Patient with negligible symptoms with Nestor Lewandowsky both with EO and EC. Worked on VOR x 2 exercises with optokinetic challenge which was well-tolerated despite c/o mild increase in symptoms. VOR cancellation activities brought on mild  dizziness today. Worked on challenging balance activities on foam and with EC which patient performed with good stability and no over LOB. She reports improving tolerance for [facility] and computer work. At this time patient is independent with HEP and tolerating sessions with minimal symptoms; thus ready for 30 day told at this time.      OBJECTIVE IMPAIRMENTS decreased activity tolerance, decreased balance, and dizziness.    ACTIVITY LIMITATIONS occupation and leisure activities .    PERSONAL FACTORS Time since onset of injury/illness/exacerbation and 1 comorbidity: hx of migraine  are also affecting patient's functional outcome.      REHAB POTENTIAL: Good   CLINICAL DECISION MAKING: Evolving/moderate complexity   EVALUATION COMPLEXITY: Moderate     PLAN: PT FREQUENCY: 1-2x/week   PT DURATION: 12 weeks   PLANNED INTERVENTIONS: Therapeutic exercises, Therapeutic activity, Neuromuscular re-education, Balance training, Gait training, Patient/Family education, Joint mobilization, Stair training, Vestibular training, and Manual therapy   PLAN FOR NEXT SESSION: 30 day hold at this time   Janene Harvey, PT, DPT 04/04/22 3:55 PM  [facility] [address], [place], [place] [address] Phone # [phone] Fax # [phone]   PHYSICAL THERAPY DISCHARGE SUMMARY  Visits from Start of Care: 5  Current functional level related to goals / functional outcomes: See above clinical impression; patient did not return during 30 day hold   Remaining deficits: Imbalance on compliant surface, limited tolerance for [employer] / Equipment: HEP  Plan: Patient agrees to discharge.  Patient goals were partially met. Patient is being discharged per her request.     Janene Harvey, PT, DPT 06/13/22 11:53 AM  [facility] at [facility] [address] [place], [place], [place] [address] Phone # [phone] Fax # [phone]

## 2022-04-04 ENCOUNTER — Encounter: Payer: Self-pay | Admitting: Physical Therapy

## 2022-04-04 ENCOUNTER — Ambulatory Visit: Payer: Medicare PPO | Admitting: Physical Therapy

## 2022-04-04 DIAGNOSIS — R42 Dizziness and giddiness: Secondary | ICD-10-CM | POA: Diagnosis not present

## 2022-04-04 DIAGNOSIS — R2681 Unsteadiness on feet: Secondary | ICD-10-CM | POA: Diagnosis not present

## 2022-04-08 DIAGNOSIS — D649 Anemia, unspecified: Secondary | ICD-10-CM | POA: Diagnosis not present

## 2022-04-14 DIAGNOSIS — D649 Anemia, unspecified: Secondary | ICD-10-CM | POA: Diagnosis not present

## 2022-04-19 DIAGNOSIS — D509 Iron deficiency anemia, unspecified: Secondary | ICD-10-CM | POA: Diagnosis not present

## 2022-06-30 DIAGNOSIS — Z85828 Personal history of other malignant neoplasm of skin: Secondary | ICD-10-CM | POA: Diagnosis not present

## 2022-06-30 DIAGNOSIS — L814 Other melanin hyperpigmentation: Secondary | ICD-10-CM | POA: Diagnosis not present

## 2022-06-30 DIAGNOSIS — L720 Epidermal cyst: Secondary | ICD-10-CM | POA: Diagnosis not present

## 2022-06-30 DIAGNOSIS — L821 Other seborrheic keratosis: Secondary | ICD-10-CM | POA: Diagnosis not present

## 2022-06-30 DIAGNOSIS — D225 Melanocytic nevi of trunk: Secondary | ICD-10-CM | POA: Diagnosis not present

## 2022-06-30 DIAGNOSIS — Z08 Encounter for follow-up examination after completed treatment for malignant neoplasm: Secondary | ICD-10-CM | POA: Diagnosis not present

## 2022-08-11 DIAGNOSIS — Z0000 Encounter for general adult medical examination without abnormal findings: Secondary | ICD-10-CM | POA: Diagnosis not present

## 2022-08-11 DIAGNOSIS — M62838 Other muscle spasm: Secondary | ICD-10-CM | POA: Diagnosis not present

## 2022-08-11 DIAGNOSIS — F411 Generalized anxiety disorder: Secondary | ICD-10-CM | POA: Diagnosis not present

## 2022-08-11 DIAGNOSIS — E782 Mixed hyperlipidemia: Secondary | ICD-10-CM | POA: Diagnosis not present

## 2022-08-11 DIAGNOSIS — J45990 Exercise induced bronchospasm: Secondary | ICD-10-CM | POA: Diagnosis not present

## 2022-08-11 DIAGNOSIS — E611 Iron deficiency: Secondary | ICD-10-CM | POA: Diagnosis not present

## 2022-08-30 DIAGNOSIS — Z1231 Encounter for screening mammogram for malignant neoplasm of breast: Secondary | ICD-10-CM | POA: Diagnosis not present

## 2022-09-05 ENCOUNTER — Encounter (HOSPITAL_BASED_OUTPATIENT_CLINIC_OR_DEPARTMENT_OTHER): Payer: Self-pay | Admitting: *Deleted

## 2022-11-15 DIAGNOSIS — H1789 Other corneal scars and opacities: Secondary | ICD-10-CM | POA: Diagnosis not present

## 2022-11-15 DIAGNOSIS — H5213 Myopia, bilateral: Secondary | ICD-10-CM | POA: Diagnosis not present

## 2022-11-15 DIAGNOSIS — H52203 Unspecified astigmatism, bilateral: Secondary | ICD-10-CM | POA: Diagnosis not present

## 2022-11-15 DIAGNOSIS — H2513 Age-related nuclear cataract, bilateral: Secondary | ICD-10-CM | POA: Diagnosis not present

## 2023-02-14 DIAGNOSIS — M62838 Other muscle spasm: Secondary | ICD-10-CM | POA: Diagnosis not present

## 2023-02-14 DIAGNOSIS — L03032 Cellulitis of left toe: Secondary | ICD-10-CM | POA: Diagnosis not present

## 2023-02-21 ENCOUNTER — Ambulatory Visit: Payer: Medicare PPO | Admitting: Physician Assistant

## 2023-02-22 ENCOUNTER — Encounter (HOSPITAL_BASED_OUTPATIENT_CLINIC_OR_DEPARTMENT_OTHER): Payer: Self-pay

## 2023-02-22 ENCOUNTER — Ambulatory Visit (HOSPITAL_BASED_OUTPATIENT_CLINIC_OR_DEPARTMENT_OTHER): Payer: Medicare PPO | Admitting: Obstetrics & Gynecology

## 2023-03-08 ENCOUNTER — Ambulatory Visit (INDEPENDENT_AMBULATORY_CARE_PROVIDER_SITE_OTHER): Payer: Medicare PPO | Admitting: Obstetrics & Gynecology

## 2023-03-08 ENCOUNTER — Encounter (HOSPITAL_BASED_OUTPATIENT_CLINIC_OR_DEPARTMENT_OTHER): Payer: Self-pay | Admitting: Obstetrics & Gynecology

## 2023-03-08 ENCOUNTER — Other Ambulatory Visit (HOSPITAL_COMMUNITY)
Admission: RE | Admit: 2023-03-08 | Discharge: 2023-03-08 | Disposition: A | Discharge status: Home / Self Care | Payer: Medicare PPO | Source: Ambulatory Visit | Attending: Obstetrics & Gynecology | Admitting: Obstetrics & Gynecology

## 2023-03-08 VITALS — BP 124/73 | HR 58 | Ht 64.0 in | Wt 118.4 lb

## 2023-03-08 DIAGNOSIS — M8588 Other specified disorders of bone density and structure, other site: Secondary | ICD-10-CM | POA: Diagnosis not present

## 2023-03-08 DIAGNOSIS — Z9079 Acquired absence of other genital organ(s): Secondary | ICD-10-CM | POA: Diagnosis not present

## 2023-03-08 DIAGNOSIS — Z01419 Encounter for gynecological examination (general) (routine) without abnormal findings: Secondary | ICD-10-CM

## 2023-03-08 DIAGNOSIS — N952 Postmenopausal atrophic vaginitis: Secondary | ICD-10-CM | POA: Diagnosis not present

## 2023-03-08 DIAGNOSIS — Z90721 Acquired absence of ovaries, unilateral: Secondary | ICD-10-CM | POA: Diagnosis not present

## 2023-03-08 DIAGNOSIS — Z124 Encounter for screening for malignant neoplasm of cervix: Secondary | ICD-10-CM

## 2023-03-08 DIAGNOSIS — Z87440 Personal history of urinary (tract) infections: Secondary | ICD-10-CM | POA: Diagnosis not present

## 2023-03-08 MED ORDER — CIPROFLOXACIN HCL 500 MG PO TABS: 500.0000 mg | ORAL_TABLET | Freq: Two times a day (BID) | ORAL | 0 refills | Status: DC

## 2023-03-08 NOTE — Progress Notes (Signed)
72 y.o. G3P3 Married White or Caucasian female here for breast and pelvic exam.  I am following her osteopenia.  Last BMD 08/2021.  She is taking calcium and Vit D.  Will repeat this year.  Denies vaginal bleeding.  Patient's last menstrual period was 03/25/2003.          Sexually active: No H/O STD:  no  Health Maintenance: PCP:  Dr. Chanetta Marshall.  Does blood work with ehr Vaccines are up to date:  yes Colonoscopy:  06/23/2017, follow up 10 year MMG:  08/30/2022 Negative BMD:  08/24/2021 Osteopenia Last pap smear:  12/17/2020 Negative.   H/o abnormal pap smear:  no    reports that she has never smoked. She has never used smokeless tobacco. She reports that she does not drink alcohol and does not use drugs.  Past Medical History:  Diagnosis Date   Allergic rhinitis    Anxiety    Asthma    mild   Basal cell carcinoma (BCC) 2019   nose   Canker sore    Chronic headaches    Colitis    Frequent PVCs    Hyperlipemia    diet only treatment   Insomnia    Migraines    Osteoarthritis    PAC (premature atrial contraction)    Palpitations    PVC's (premature ventricular contractions) 01/17/2017   Stress incontinence of urine    Tinnitus    TMJ (dislocation of temporomandibular joint)     Past Surgical History:  Procedure Laterality Date   BILATERAL SALPINGOOPHORECTOMY  4/08   BREAST FIBROADENOMA SURGERY  12/95   HAND SURGERY     for cyst--Dr. Merlyn Lot   MOHS SURGERY  2019   nose   MOLE REMOVAL     multiple   oral surgeries     multiple-as a child   RHINOPLASTY  1977   TENDON RELEASE  1996    Current Outpatient Medications  Medication Sig Dispense Refill   b complex vitamins tablet Take 1 tablet by mouth daily.     Calcium Carb-Cholecalciferol (CALCIUM 500 + D) 500-200 MG-UNIT TABS 1 tablet with a meal     cholecalciferol (VITAMIN D) 1000 units tablet Take 2,000 Units by mouth daily.      Ciclopirox 0.77 % gel 2 (two) times daily as needed.     conjugated estrogens  (PREMARIN) vaginal cream INSERT 1/2 GRAM VAGINALLY  2 TIMES PER WEEK 42.5 g 3   L-Lysine 1000 MG TABS Take by mouth daily.     magnesium 30 MG tablet Take 30 mg by mouth 2 (two) times daily.     POTASSIUM PO Take 99 mg by mouth daily.     pyridOXINE (VITAMIN B-6) 100 MG tablet Take 100 mg by mouth daily.     rosuvastatin (CRESTOR) 5 MG tablet Take 5 mg by mouth daily.     sertraline (ZOLOFT) 50 MG tablet Take 50 mg by mouth daily.     triamcinolone cream (KENALOG) 0.5 %      Turmeric 500 MG CAPS Take 500 mg by mouth 2 (two) times daily.     ciprofloxacin (CIPRO) 500 MG tablet Take 1 tablet (500 mg total) by mouth 2 (two) times daily. 10 tablet 0   No current facility-administered medications for this visit.    Family History  Problem Relation Age of Onset   Pulmonary fibrosis Maternal Grandmother    Heart failure Mother    Atrial fibrillation Father  PAT   Pulmonary fibrosis Maternal Aunt    Pulmonary fibrosis Other        mothers great aunt and great neice(cousin to patient)    Review of Systems  Constitutional: Negative.   Genitourinary: Negative.     Exam:   BP 124/73 (BP Location: Left Arm, Patient Position: Sitting, Cuff Size: Normal)   Pulse (!) 58   Ht 5\' 4"  (1.626 m)   Wt 118 lb 6.4 oz (53.7 kg)   LMP 03/25/2003   BMI 20.32 kg/m   Height: 5\' 4"  (162.6 cm)  General appearance: alert, cooperative and appears stated age Breasts: normal appearance, no masses or tenderness Abdomen: soft, non-tender; bowel sounds normal; no masses,  no organomegaly Lymph nodes: Cervical, supraclavicular, and axillary nodes normal.  No abnormal inguinal nodes palpated Neurologic: Grossly normal  Pelvic: External genitalia:  no lesions              Urethra:  normal appearing urethra with no masses, tenderness or lesions              Bartholins and Skenes: normal                 Vagina: normal appearing vagina with atrophic changes and no discharge, no lesions               Cervix: no lesions              Pap taken: Yes.   Bimanual Exam:  Uterus:  normal size, contour, position, consistency, mobility, non-tender              Adnexa: normal adnexa and no mass, fullness, tenderness               Rectovaginal: Confirms               Anus:  normal sphincter tone, no lesions  Chaperone, Ina Homes, CMA, was present for exam.  Assessment/Plan: 1. Encntr for gyn exam (general) (routine) w/o abn findings - Pap smear obtained today per pt request.  We did review guidelines today. - Mammogram 08/30/2022 - Colonoscopy 06/23/2017, follow up 10 years - Bone mineral density 08/24/2021, osteopenia - lab work done with PCP, Dr. Chanetta Marshall - vaccines reviewed/updated  2. History of salpingoophorectomy  3. Vaginal atrophy - RF for estrogen cream sent to pharmacy on file  4. History of recurrent UTI (urinary tract infection) - ciprofloxacin (CIPRO) 500 MG tablet; Take 1 tablet (500 mg total) by mouth 2 (two) times daily.  Dispense: 10 tablet; Refill: 0  5. Osteopenia of lumbar spine - DG BONE DENSITY (DXA); Future  6. Cervical cancer screening - Cytology - PAP( [place]) - PR OBTAINING SCREEN PAP SMEAR

## 2023-03-10 LAB — CYTOLOGY - PAP: Diagnosis: NEGATIVE

## 2023-03-11 DIAGNOSIS — M8588 Other specified disorders of bone density and structure, other site: Secondary | ICD-10-CM | POA: Insufficient documentation

## 2023-03-11 DIAGNOSIS — Z87440 Personal history of urinary (tract) infections: Secondary | ICD-10-CM | POA: Insufficient documentation

## 2023-03-11 DIAGNOSIS — N952 Postmenopausal atrophic vaginitis: Secondary | ICD-10-CM | POA: Insufficient documentation

## 2023-04-08 NOTE — Progress Notes (Unsigned)
Cardiology Office Note:    Date:  04/11/2023   ID:  Margaret [name], DOB [birth year], MRN [id number]  PCP:  Shon Hale, MD   [facility] Providers Cardiologist:  Armanda Magic, MD Cardiology APP:  Marcelino Duster, PA {  Referring MD: Shon Hale, *   Chief Complaint  Patient presents with   Follow-up    palpitations    History of Present Illness:    Margaret [name] is a 72 y.o. female with a hx of PACs, PVCs, and atrial tachycardia as noted on a heart monitor in 2018.  She also has hyperlipidemia.  Heart monitor placed for palpitations and showed the above.  She was only symptomatic with PVCs at night and not during the day.  No CCB or BB added due to soft blood pressure.  Referred back to Dr. Mayford Knife by her PCP 02/23/2022 for palpitations.  14-day Zio patch showed nonsustained atrial tachycardia for 5 beats and occasional PVCs.  She had 1 run of NSVT.  BMP, magnesium, and TSH were all WNL and.  She was again not started on BB or CCB due to soft blood pressure.  Who presents today for routine follow-up.  She is very aware of her palpitations. No syncope. She remains active - 85k steps per week. She reports 17k steps yesterday. She exercises and walks picking up trash 4-5 miles. She has walked the length of [place] in 2 years.    Past Medical History:  Diagnosis Date   Allergic rhinitis    Anxiety    Asthma    mild   Basal cell carcinoma (BCC) 2019   nose   Canker sore    Chronic headaches    Colitis    Frequent PVCs    Hyperlipemia    diet only treatment   Insomnia    Migraines    Osteoarthritis    PAC (premature atrial contraction)    Palpitations    PVC's (premature ventricular contractions) 01/17/2017   Stress incontinence of urine    Tinnitus    TMJ (dislocation of temporomandibular joint)     Past Surgical History:  Procedure Laterality Date   BILATERAL SALPINGOOPHORECTOMY  4/08   BREAST FIBROADENOMA SURGERY  12/95   HAND SURGERY      for cyst--Dr. Merlyn Lot   MOHS SURGERY  2019   nose   MOLE REMOVAL     multiple   oral surgeries     multiple-as a child   RHINOPLASTY  1977   TENDON RELEASE  1996    Current Medications: Current Meds  Medication Sig   b complex vitamins tablet Take 1 tablet by mouth daily.   Calcium Carb-Cholecalciferol (CALCIUM 500 + D) 500-200 MG-UNIT TABS 1 tablet with a meal   cholecalciferol (VITAMIN D) 1000 units tablet Take 2,000 Units by mouth daily.    Ciclopirox 0.77 % gel 2 (two) times daily as needed.   ciprofloxacin (CIPRO) 500 MG tablet Take 1 tablet (500 mg total) by mouth 2 (two) times daily.   conjugated estrogens (PREMARIN) vaginal cream INSERT 1/2 GRAM VAGINALLY  2 TIMES PER WEEK   conjugated estrogens (PREMARIN) vaginal cream Place 1 applicator vaginally 2 (two) times a week. Uses 0.25gm   L-Lysine 1000 MG TABS Take by mouth daily.   magnesium 30 MG tablet Take 30 mg by mouth 2 (two) times daily.   POTASSIUM PO Take 99 mg by mouth daily.   pyridOXINE (VITAMIN B-6) 100 MG tablet Take 100 mg by  mouth daily.   rosuvastatin (CRESTOR) 5 MG tablet Take 5 mg by mouth daily.   sertraline (ZOLOFT) 50 MG tablet Take 50 mg by mouth daily.   triamcinolone cream (KENALOG) 0.5 %    Turmeric 500 MG CAPS Take 500 mg by mouth 2 (two) times daily.     Allergies:   Adhesive [tape], Augmentin [amoxicillin-pot clavulanate], Latex, Sulfa antibiotics, Doxycycline, Monascus purpureus went yeast, Niacin and related, and Red yeast rice [cholestin]   Social History   Socioeconomic History   Marital status: Married    Spouse name: Not on file   Number of children: Not on file   Years of education: Not on file   Highest education level: Not on file  Occupational History   Occupation: retired  Tobacco Use   Smoking status: Never   Smokeless tobacco: Never  Vaping Use   Vaping Use: Never used  Substance and Sexual Activity   Alcohol use: No   Drug use: No   Sexual activity: Not Currently     Partners: Male    Birth control/protection: Post-menopausal  Other Topics Concern   Not on file  Social History Narrative   Not on file   Social Determinants of Health   Financial Resource Strain: Not on file  Food Insecurity: Not on file  Transportation Needs: Not on file  Physical Activity: Not on file  Stress: Not on file  Social Connections: Not on file     Family History: The patient's family history includes Atrial fibrillation in her father; Heart failure in her mother; Pulmonary fibrosis in her maternal aunt, maternal grandmother, and another family member.  ROS:   Please see the history of present illness.     All other systems reviewed and are negative.  EKGs/Labs/Other Studies Reviewed:    The following studies were reviewed today:  Cardiac Studies & Procedures         MONITORS  CARDIAC EVENT MONITOR 03/30/2022  Narrative  Predominant rhythm is normal sinus rhythm with an average heart rate of 65 bpm and ranged from 44 to 133 bpm  Occasional PACs, atrial couplets and nonsustained atrial tachycardia up to 5 beats  Nonsustained ventricular tachycardia up to 6 beats and PVCs with PVC load less than 1%            EKG:  EKG is  ordered today.  The ekg ordered today demonstrates  EKG Interpretation  Date/Time:  Tuesday April 11 2023 15:51:17 EDT Ventricular Rate:  60 PR Interval:  140 QRS Duration: 80 QT Interval:  430 QTC Calculation: 430 R Axis:   26 Text Interpretation: Normal sinus rhythm Possible Left atrial enlargement When compared with ECG of 05-Jun-2007 11:18, T wave inversion now evident in Anterior leads Confirmed by Micah Flesher ([id number]) on 04/11/2023 4:19:53 PM   Recent Labs: No results found for requested labs within last 365 days.  Recent Lipid Panel No results found for: "CHOL", "TRIG", "HDL", "CHOLHDL", "VLDL", "LDLCALC", "LDLDIRECT"   Risk Assessment/Calculations:                Physical Exam:    VS:  BP 116/70 (BP Location:  Right Arm, Patient Position: Sitting, Cuff Size: Normal)   Pulse 60   Ht 5\' 4"  (1.626 m)   Wt 117 lb 12.8 oz (53.4 kg)   LMP 03/25/2003   SpO2 96%   BMI 20.22 kg/m     Wt Readings from Last 3 Encounters:  04/11/23 117 lb 12.8 oz (53.4 kg)  03/08/23  118 lb 6.4 oz (53.7 kg)  02/23/22 116 lb (52.6 kg)     GEN:  Well nourished, well developed in no acute distress HEENT: Normal NECK: No JVD; No carotid bruits LYMPHATICS: No lymphadenopathy CARDIAC: RRR, no murmurs, rubs, gallops RESPIRATORY:  Clear to auscultation without rales, wheezing or rhonchi  ABDOMEN: Soft, non-tender, non-distended MUSCULOSKELETAL:  No edema; No deformity  SKIN: Warm and dry NEUROLOGIC:  Alert and oriented x 3 PSYCHIATRIC:  Normal affect   ASSESSMENT:    1. PVC's (premature ventricular contractions)   2. PAT (paroxysmal atrial tachycardia)   3. Palpitations   4. Hyperlipidemia with target LDL less than 70    PLAN:    In order of problems listed above:  PVCs Atrial tachycardia Palpitations Reassuring heart monitor 2023 No BB or CCB due to soft blood pressure Palpitations are bothersome but not impeding daily function, no change in medicaitons   Hyperlipidemia 5 mg Crestor Lipid panel 07/2022 Total chol 188 HDL 47 LDL 115 Triglycerides 145 Increase to 10 mg crestor MWF (pt reluctant to add medications) - we will follow how she feels regarding leg cramping           Medication Adjustments/Labs and Tests Ordered: Current medicines are reviewed at length with the patient today.  Concerns regarding medicines are outlined above.  Orders Placed This Encounter  Procedures   EKG 12-Lead   No orders of the defined types were placed in this encounter.   There are no Patient Instructions on file for this visit.   Signed, Marcelino Duster, [place]  04/11/2023 4:27 PM    [facility]

## 2023-04-11 ENCOUNTER — Ambulatory Visit: Payer: Medicare PPO | Attending: Physician Assistant | Admitting: Physician Assistant

## 2023-04-11 ENCOUNTER — Encounter: Payer: Self-pay | Admitting: Physician Assistant

## 2023-04-11 ENCOUNTER — Other Ambulatory Visit: Payer: Self-pay

## 2023-04-11 VITALS — BP 116/70 | HR 60 | Ht 64.0 in | Wt 117.8 lb

## 2023-04-11 DIAGNOSIS — I4719 Other supraventricular tachycardia: Secondary | ICD-10-CM | POA: Diagnosis not present

## 2023-04-11 DIAGNOSIS — E785 Hyperlipidemia, unspecified: Secondary | ICD-10-CM

## 2023-04-11 DIAGNOSIS — R002 Palpitations: Secondary | ICD-10-CM | POA: Diagnosis not present

## 2023-04-11 DIAGNOSIS — I493 Ventricular premature depolarization: Secondary | ICD-10-CM | POA: Diagnosis not present

## 2023-04-11 MED ORDER — ROSUVASTATIN CALCIUM 5 MG PO TABS: ORAL_TABLET | ORAL | 3 refills | Status: DC

## 2023-04-11 NOTE — Patient Instructions (Signed)
Medication Instructions:   TAKE Crestor 5 mg on Sundays, Tuesdays, Thursdays and Saturdays. Take 10 mg (2 tablets) on Mondays, Wednesdays and Fridays.  *If you need a refill on your cardiac medications before your next appointment, please call your pharmacy*  Lab Work: NONE ordered at this time of appointment   If you have labs (blood work) drawn today and your tests are completely normal, you will receive your results only by: MyChart Message (if you have MyChart) OR A paper copy in the mail If you have any lab test that is abnormal or we need to change your treatment, we will call you to review the results.  Testing/Procedures: NONE ordered at this time of appointment   Follow-Up: At [facility], you and your health needs are our priority.  As part of our continuing mission to provide you with exceptional heart care, we have created designated Provider Care Teams.  These Care Teams include your primary Cardiologist (physician) and Advanced Practice Providers (APPs -  Physician Assistants and Nurse Practitioners) who all work together to provide you with the care you need, when you need it.   Your next appointment:   1 year(s)  Provider:   Armanda Magic, MD     Other Instructions

## 2023-06-14 DIAGNOSIS — E785 Hyperlipidemia, unspecified: Secondary | ICD-10-CM | POA: Diagnosis not present

## 2023-06-14 DIAGNOSIS — I471 Supraventricular tachycardia, unspecified: Secondary | ICD-10-CM | POA: Diagnosis not present

## 2023-06-14 DIAGNOSIS — M62838 Other muscle spasm: Secondary | ICD-10-CM | POA: Diagnosis not present

## 2023-06-14 DIAGNOSIS — M8580 Other specified disorders of bone density and structure, unspecified site: Secondary | ICD-10-CM | POA: Diagnosis not present

## 2023-06-14 DIAGNOSIS — F324 Major depressive disorder, single episode, in partial remission: Secondary | ICD-10-CM | POA: Diagnosis not present

## 2023-06-14 DIAGNOSIS — R32 Unspecified urinary incontinence: Secondary | ICD-10-CM | POA: Diagnosis not present

## 2023-06-14 DIAGNOSIS — F411 Generalized anxiety disorder: Secondary | ICD-10-CM | POA: Diagnosis not present

## 2023-06-14 DIAGNOSIS — Z8249 Family history of ischemic heart disease and other diseases of the circulatory system: Secondary | ICD-10-CM | POA: Diagnosis not present

## 2023-06-14 DIAGNOSIS — J45990 Exercise induced bronchospasm: Secondary | ICD-10-CM | POA: Diagnosis not present

## 2023-07-04 DIAGNOSIS — D492 Neoplasm of unspecified behavior of bone, soft tissue, and skin: Secondary | ICD-10-CM | POA: Diagnosis not present

## 2023-07-04 DIAGNOSIS — L814 Other melanin hyperpigmentation: Secondary | ICD-10-CM | POA: Diagnosis not present

## 2023-07-04 DIAGNOSIS — L821 Other seborrheic keratosis: Secondary | ICD-10-CM | POA: Diagnosis not present

## 2023-07-04 DIAGNOSIS — L570 Actinic keratosis: Secondary | ICD-10-CM | POA: Diagnosis not present

## 2023-07-04 DIAGNOSIS — Z08 Encounter for follow-up examination after completed treatment for malignant neoplasm: Secondary | ICD-10-CM | POA: Diagnosis not present

## 2023-07-04 DIAGNOSIS — Z85828 Personal history of other malignant neoplasm of skin: Secondary | ICD-10-CM | POA: Diagnosis not present

## 2023-07-04 DIAGNOSIS — D225 Melanocytic nevi of trunk: Secondary | ICD-10-CM | POA: Diagnosis not present

## 2023-07-17 NOTE — Progress Notes (Signed)
Order(s) created erroneously. Erroneous order ID: [id number]  Order moved by: Ian Malkin  Order move date/time: 07/17/2023 9:39 PM  Source Patient: [id number]  Source Contact: 04/11/2023  Destination Patient: [id number]  Destination Contact: 04/05/2023

## 2023-07-17 NOTE — Progress Notes (Signed)
Order(s) created erroneously. Erroneous order ID: [id number]  Order moved by: Ian Malkin  Order move date/time: 07/17/2023 9:42 PM  Source Patient: [id number]  Source Contact: 04/11/2023  Destination Patient: [id number]  Destination Contact: 04/05/2023

## 2023-08-15 DIAGNOSIS — I4719 Other supraventricular tachycardia: Secondary | ICD-10-CM | POA: Diagnosis not present

## 2023-08-15 DIAGNOSIS — J45990 Exercise induced bronchospasm: Secondary | ICD-10-CM | POA: Diagnosis not present

## 2023-08-15 DIAGNOSIS — F411 Generalized anxiety disorder: Secondary | ICD-10-CM | POA: Diagnosis not present

## 2023-08-15 DIAGNOSIS — E611 Iron deficiency: Secondary | ICD-10-CM | POA: Diagnosis not present

## 2023-08-15 DIAGNOSIS — E46 Unspecified protein-calorie malnutrition: Secondary | ICD-10-CM | POA: Diagnosis not present

## 2023-08-15 DIAGNOSIS — Z0000 Encounter for general adult medical examination without abnormal findings: Secondary | ICD-10-CM | POA: Diagnosis not present

## 2023-08-15 DIAGNOSIS — Z79899 Other long term (current) drug therapy: Secondary | ICD-10-CM | POA: Diagnosis not present

## 2023-08-15 DIAGNOSIS — E559 Vitamin D deficiency, unspecified: Secondary | ICD-10-CM | POA: Diagnosis not present

## 2023-08-15 DIAGNOSIS — E782 Mixed hyperlipidemia: Secondary | ICD-10-CM | POA: Diagnosis not present

## 2023-09-01 DIAGNOSIS — M8588 Other specified disorders of bone density and structure, other site: Secondary | ICD-10-CM | POA: Diagnosis not present

## 2023-09-01 DIAGNOSIS — Z1231 Encounter for screening mammogram for malignant neoplasm of breast: Secondary | ICD-10-CM | POA: Diagnosis not present

## 2023-09-07 DIAGNOSIS — Z03818 Encounter for observation for suspected exposure to other biological agents ruled out: Secondary | ICD-10-CM | POA: Diagnosis not present

## 2023-09-07 DIAGNOSIS — R053 Chronic cough: Secondary | ICD-10-CM | POA: Diagnosis not present

## 2023-09-07 DIAGNOSIS — R059 Cough, unspecified: Secondary | ICD-10-CM | POA: Diagnosis not present

## 2023-09-07 DIAGNOSIS — Z20822 Contact with and (suspected) exposure to covid-19: Secondary | ICD-10-CM | POA: Diagnosis not present

## 2023-09-07 DIAGNOSIS — H938X2 Other specified disorders of left ear: Secondary | ICD-10-CM | POA: Diagnosis not present

## 2023-09-12 ENCOUNTER — Encounter (HOSPITAL_BASED_OUTPATIENT_CLINIC_OR_DEPARTMENT_OTHER): Payer: Self-pay | Admitting: Obstetrics & Gynecology

## 2023-11-16 DIAGNOSIS — H52203 Unspecified astigmatism, bilateral: Secondary | ICD-10-CM | POA: Diagnosis not present

## 2023-11-16 DIAGNOSIS — H2513 Age-related nuclear cataract, bilateral: Secondary | ICD-10-CM | POA: Diagnosis not present

## 2023-12-15 DIAGNOSIS — R29898 Other symptoms and signs involving the musculoskeletal system: Secondary | ICD-10-CM | POA: Diagnosis not present

## 2024-01-23 DIAGNOSIS — M25551 Pain in right hip: Secondary | ICD-10-CM | POA: Diagnosis not present

## 2024-04-25 ENCOUNTER — Other Ambulatory Visit: Payer: Self-pay

## 2024-04-25 MED ORDER — ROSUVASTATIN CALCIUM 5 MG PO TABS: ORAL_TABLET | ORAL | 0 refills | Status: AC

## 2024-05-10 ENCOUNTER — Telehealth (HOSPITAL_BASED_OUTPATIENT_CLINIC_OR_DEPARTMENT_OTHER): Payer: Self-pay | Admitting: *Deleted

## 2024-05-10 DIAGNOSIS — Z87440 Personal history of urinary (tract) infections: Secondary | ICD-10-CM

## 2024-05-10 DIAGNOSIS — N952 Postmenopausal atrophic vaginitis: Secondary | ICD-10-CM

## 2024-05-10 MED ORDER — PREMARIN 0.625 MG/GM VA CREA
1.0000 | TOPICAL_CREAM | VAGINAL | 2 refills | Status: AC
Start: 2024-05-13 — End: ?

## 2024-05-10 MED ORDER — CIPROFLOXACIN HCL 500 MG PO TABS
500.0000 mg | ORAL_TABLET | Freq: Two times a day (BID) | ORAL | 0 refills | Status: AC
Start: 2024-05-10 — End: ?

## 2024-05-10 NOTE — Telephone Encounter (Signed)
 TC from pt in VM requesting refills for vag estrogen cream and cipro  for recurrent UTI to be sent to [facility].  Reviewed chart.  Last annual exam May 2024 [facility] pt, not high risk so seen every 2 years. Up to date on mammo and bone density.  There is a recall in to call patient for her visit for May 2026. Prescriptions verified for approval by Dr Cleotilde. Sotero CMA

## 2024-07-04 DIAGNOSIS — L821 Other seborrheic keratosis: Secondary | ICD-10-CM | POA: Diagnosis not present

## 2024-07-04 DIAGNOSIS — Z08 Encounter for follow-up examination after completed treatment for malignant neoplasm: Secondary | ICD-10-CM | POA: Diagnosis not present

## 2024-07-04 DIAGNOSIS — Z85828 Personal history of other malignant neoplasm of skin: Secondary | ICD-10-CM | POA: Diagnosis not present

## 2024-07-04 DIAGNOSIS — D492 Neoplasm of unspecified behavior of bone, soft tissue, and skin: Secondary | ICD-10-CM | POA: Diagnosis not present

## 2024-07-04 DIAGNOSIS — L538 Other specified erythematous conditions: Secondary | ICD-10-CM | POA: Diagnosis not present

## 2024-07-04 DIAGNOSIS — D225 Melanocytic nevi of trunk: Secondary | ICD-10-CM | POA: Diagnosis not present

## 2024-07-04 DIAGNOSIS — L814 Other melanin hyperpigmentation: Secondary | ICD-10-CM | POA: Diagnosis not present

## 2024-07-04 DIAGNOSIS — L820 Inflamed seborrheic keratosis: Secondary | ICD-10-CM | POA: Diagnosis not present

## 2024-08-19 DIAGNOSIS — Z79899 Other long term (current) drug therapy: Secondary | ICD-10-CM | POA: Diagnosis not present

## 2024-08-19 DIAGNOSIS — J45990 Exercise induced bronchospasm: Secondary | ICD-10-CM | POA: Diagnosis not present

## 2024-08-19 DIAGNOSIS — E611 Iron deficiency: Secondary | ICD-10-CM | POA: Diagnosis not present

## 2024-08-19 DIAGNOSIS — E559 Vitamin D deficiency, unspecified: Secondary | ICD-10-CM | POA: Diagnosis not present

## 2024-08-19 DIAGNOSIS — I4719 Other supraventricular tachycardia: Secondary | ICD-10-CM | POA: Diagnosis not present

## 2024-08-19 DIAGNOSIS — Z0000 Encounter for general adult medical examination without abnormal findings: Secondary | ICD-10-CM | POA: Diagnosis not present

## 2024-08-19 DIAGNOSIS — F411 Generalized anxiety disorder: Secondary | ICD-10-CM | POA: Diagnosis not present

## 2024-08-19 DIAGNOSIS — R0609 Other forms of dyspnea: Secondary | ICD-10-CM | POA: Diagnosis not present

## 2024-08-19 DIAGNOSIS — E782 Mixed hyperlipidemia: Secondary | ICD-10-CM | POA: Diagnosis not present

## 2024-08-21 ENCOUNTER — Other Ambulatory Visit (HOSPITAL_COMMUNITY): Payer: Self-pay | Admitting: Family Medicine

## 2024-08-21 DIAGNOSIS — R0609 Other forms of dyspnea: Secondary | ICD-10-CM

## 2024-08-29 ENCOUNTER — Encounter (HOSPITAL_COMMUNITY): Payer: Self-pay

## 2024-08-29 ENCOUNTER — Telehealth (HOSPITAL_COMMUNITY): Payer: Self-pay | Admitting: *Deleted

## 2024-08-29 NOTE — Telephone Encounter (Signed)
 Attempted to call patient regarding upcoming cardiac CT appointment. Left message on voicemail with name and callback number  Larey Brick RN Navigator Cardiac Imaging [facility] [phone] Office [phone] Cell

## 2024-08-29 NOTE — Telephone Encounter (Signed)
Reaching out to patient to offer assistance regarding upcoming cardiac imaging study; pt verbalizes understanding of appt date/time, parking situation and where to check in, pre-test NPO status  and verified current allergies; name and call back number provided for further questions should they arise ? ?Adalaya Irion RN Navigator Cardiac Imaging ?[facility] ?[phone] office ?[phone] cell ? ?

## 2024-08-30 ENCOUNTER — Ambulatory Visit (HOSPITAL_COMMUNITY)
Admission: RE | Admit: 2024-08-30 | Discharge: 2024-08-30 | Disposition: A | Discharge status: Home / Self Care | Source: Ambulatory Visit | Attending: Internal Medicine | Admitting: Internal Medicine

## 2024-08-30 DIAGNOSIS — I2510 Atherosclerotic heart disease of native coronary artery without angina pectoris: Secondary | ICD-10-CM | POA: Diagnosis not present

## 2024-08-30 DIAGNOSIS — R0609 Other forms of dyspnea: Secondary | ICD-10-CM | POA: Diagnosis not present

## 2024-08-30 DIAGNOSIS — R931 Abnormal findings on diagnostic imaging of heart and coronary circulation: Secondary | ICD-10-CM | POA: Diagnosis not present

## 2024-08-30 DIAGNOSIS — I2584 Coronary atherosclerosis due to calcified coronary lesion: Secondary | ICD-10-CM | POA: Diagnosis not present

## 2024-08-30 MED ORDER — IOHEXOL 350 MG/ML SOLN
95.0000 mL | Freq: Once | INTRAVENOUS | Status: AC | PRN
  Administered 2024-08-30: 95 mL via INTRAVENOUS

## 2024-08-30 MED ORDER — NITROGLYCERIN 0.4 MG SL SUBL
0.8000 mg | SUBLINGUAL_TABLET | Freq: Once | SUBLINGUAL | Status: AC
  Administered 2024-08-30: 0.8 mg via SUBLINGUAL

## 2024-09-02 ENCOUNTER — Other Ambulatory Visit: Payer: Self-pay | Admitting: Student in an Organized Health Care Education/Training Program

## 2024-09-02 ENCOUNTER — Ambulatory Visit (HOSPITAL_COMMUNITY)
Admission: RE | Admit: 2024-09-02 | Discharge: 2024-09-02 | Disposition: A | Source: Ambulatory Visit | Attending: Student in an Organized Health Care Education/Training Program

## 2024-09-02 DIAGNOSIS — R931 Abnormal findings on diagnostic imaging of heart and coronary circulation: Secondary | ICD-10-CM

## 2024-09-02 DIAGNOSIS — I2510 Atherosclerotic heart disease of native coronary artery without angina pectoris: Secondary | ICD-10-CM | POA: Diagnosis not present

## 2024-09-02 DIAGNOSIS — I2584 Coronary atherosclerosis due to calcified coronary lesion: Secondary | ICD-10-CM | POA: Diagnosis not present

## 2024-09-02 DIAGNOSIS — R0609 Other forms of dyspnea: Secondary | ICD-10-CM | POA: Diagnosis not present

## 2024-09-02 NOTE — Progress Notes (Signed)
CT FFR ordered.  

## 2024-09-10 ENCOUNTER — Encounter (HOSPITAL_BASED_OUTPATIENT_CLINIC_OR_DEPARTMENT_OTHER): Payer: Self-pay

## 2024-09-10 DIAGNOSIS — N6489 Other specified disorders of breast: Secondary | ICD-10-CM | POA: Diagnosis not present

## 2024-09-10 DIAGNOSIS — R928 Other abnormal and inconclusive findings on diagnostic imaging of breast: Secondary | ICD-10-CM | POA: Diagnosis not present

## 2024-09-17 DIAGNOSIS — R0609 Other forms of dyspnea: Secondary | ICD-10-CM | POA: Diagnosis not present

## 2024-09-17 DIAGNOSIS — E7800 Pure hypercholesterolemia, unspecified: Secondary | ICD-10-CM | POA: Diagnosis not present

## 2024-09-24 ENCOUNTER — Other Ambulatory Visit (HOSPITAL_COMMUNITY): Payer: Self-pay | Admitting: Family Medicine

## 2024-09-24 DIAGNOSIS — R0609 Other forms of dyspnea: Secondary | ICD-10-CM

## 2024-11-05 ENCOUNTER — Ambulatory Visit (HOSPITAL_COMMUNITY)
Admission: RE | Admit: 2024-11-05 | Discharge: 2024-11-05 | Disposition: A | Discharge status: Home / Self Care | Source: Ambulatory Visit | Attending: Family Medicine | Admitting: Family Medicine

## 2024-11-05 DIAGNOSIS — R0609 Other forms of dyspnea: Secondary | ICD-10-CM | POA: Diagnosis present

## 2024-11-05 LAB — ECHOCARDIOGRAM COMPLETE
Area-P 1/2: 2.78 cm2
S' Lateral: 2.2 cm

## 2025-03-12 ENCOUNTER — Ambulatory Visit (HOSPITAL_BASED_OUTPATIENT_CLINIC_OR_DEPARTMENT_OTHER): Admitting: Obstetrics & Gynecology

## 2025-03-31 ENCOUNTER — Ambulatory Visit (HOSPITAL_BASED_OUTPATIENT_CLINIC_OR_DEPARTMENT_OTHER): Admitting: Obstetrics & Gynecology
